# Patient Record
Sex: Female | Born: 1995
Health system: Southern US, Community
[De-identification: ages and names within clinical notes are randomized; demographics above are authoritative.]

## PROBLEM LIST (undated history)

## (undated) DIAGNOSIS — J45909 Unspecified asthma, uncomplicated: Secondary | ICD-10-CM

## (undated) HISTORY — PX: NO PAST SURGERIES: SHX2092

---
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
## 2016-03-31 ENCOUNTER — Other Ambulatory Visit
Admission: RE | Admit: 2016-03-31 | Discharge: 2016-03-31 | Disposition: A | Discharge status: Home / Self Care | Payer: BLUE CROSS/BLUE SHIELD | Source: Ambulatory Visit | Attending: Physician Assistant | Admitting: Physician Assistant

## 2016-03-31 DIAGNOSIS — Z113 Encounter for screening for infections with a predominantly sexual mode of transmission: Secondary | ICD-10-CM | POA: Insufficient documentation

## 2016-04-01 LAB — HIV ANTIBODY (ROUTINE TESTING W REFLEX): HIV Screen 4th Generation wRfx: NONREACTIVE

## 2016-04-01 LAB — SYPHILIS: RPR W/REFLEX TO RPR TITER AND TREPONEMAL ANTIBODIES, TRADITIONAL SCREENING AND DIAGNOSIS ALGORITHM: RPR Ser Ql: NONREACTIVE

## 2017-03-08 ENCOUNTER — Encounter: Payer: Self-pay | Admitting: *Deleted

## 2017-03-14 NOTE — Discharge Instructions (Signed)
T & A INSTRUCTION SHEET - [facility] °[facility] ° °CREIGHTON VAUGHT, MD °PAUL H. JUENGEL, MD  °P. SCOTT BENNETT °CHAPMAN MCQUEEN, MD ° °[address] , [place] [address] [place]. [phone] °[address] [place] [address] [phone] ° °INFORMATION SHEET FOR A TONSILLECTOMY AND ADENDOIDECTOMY ° °About Your Tonsils and Adenoids ° The tonsils and adenoids are normal body tissues that are part of our immune system.  They normally help to protect us against diseases that may enter our mouth and nose.  However, sometimes the tonsils and/or adenoids become too large and obstruct our breathing, especially at night. °  ° If either of these things happen it helps to remove the tonsils and adenoids in order to become healthier. The operation to remove the tonsils and adenoids is called a tonsillectomy and adenoidectomy. ° °The Location of Your Tonsils and Adenoids ° The tonsils are located in the back of the throat on both side and sit in a cradle of muscles. The adenoids are located in the roof of the mouth, behind the nose, and closely associated with the opening of the Eustachian tube to the ear. ° °Surgery on Tonsils and Adenoids ° A tonsillectomy and adenoidectomy is a short operation which takes about thirty minutes.  This includes being put to sleep and being awakened.  Tonsillectomies and adenoidectomies are performed at [facility] and may require observation period in the recovery room prior to going home. ° °Following the Operation for a Tonsillectomy ° A cautery machine is used to control bleeding.  Bleeding from a tonsillectomy and adenoidectomy is minimal and postoperatively the risk of bleeding is approximately four percent, although this rarely life threatening. ° ° ° °After your tonsillectomy and adenoidectomy post-op care at home: ° °1. Our patients are able to go home the same day.  You may be given prescriptions for pain  medications and antibiotics, if indicated. °2. It is extremely important to remember that fluid intake is of utmost importance after a tonsillectomy.  The amount that you drink must be maintained in the postoperative period.  A good indication of whether a child is getting enough fluid is whether his/her urine output is constant.  As long as children are urinating or wetting their diaper every 6 - 8 hours this is usually enough fluid intake.   °3. Although rare, this is a risk of some bleeding in the first ten days after surgery.  This is usually occurs between day five and nine postoperatively.  This risk of bleeding is approximately four percent.  If you or your child should have any bleeding you should remain calm and notify our office or go directly to the Emergency Room at [facility] where they will contact us. Our doctors are available seven days a week for notification.  We recommend sitting up quietly in a chair, place an ice pack on the front of the neck and spitting out the blood gently until we are able to contact you.  Adults should gargle gently with ice water and this may help stop the bleeding.  If the bleeding does not stop after a short time, i.e. 10 to 15 minutes, or seems to be increasing again, please contact us or go to the hospital.   °4. It is common for the pain to be worse at 5 - 7 days postoperatively.  This occurs because the “scab” is peeling off and the mucous membrane (skin of   the throat) is growing back where the tonsils were.   °5. It is common for a low-grade fever, less than 102, during the first week after a tonsillectomy and adenoidectomy.  It is usually due to not drinking enough liquids, and we suggest your use liquid Tylenol or the pain medicine with Tylenol prescribed in order to keep your temperature below 102.  Please follow the directions on the back of the bottle. °6. Do not take aspirin or any products that contain aspirin such as Bufferin, Anacin,  Ecotrin, aspirin gum, Goodies, BC headache powders, etc., after a T&A because it can promote bleeding.  Please check with our office before administering any other medication that may been prescribed by other doctors during the two week post-operative period. °7. If you happen to look in the mirror or into your child’s mouth you will see white/gray patches on the back of the throat.  This is what a scab looks like in the mouth and is normal after having a T&A.  It will disappear once the tonsil area heals completely. However, it may cause a noticeable odor, and this too will disappear with time.     °8. You or your child may experience ear pain after having a T&A.  This is called referred pain and comes from the throat, but it is felt in the ears.  Ear pain is quite common and expected.  It will usually go away after ten days.  There is usually nothing wrong with the ears, and it is primarily due to the healing area stimulating the nerve to the ear that runs along the side of the throat.  Use either the prescribed pain medicine or Tylenol as needed.  °9. The throat tissues after a tonsillectomy are obviously sensitive.  Smoking around children who have had a tonsillectomy significantly increases the risk of bleeding.  DO NOT SMOKE!  ° °General Anesthesia, Adult, Care After °These instructions provide you with information about caring for yourself after your procedure. Your health care provider may also give you more specific instructions. Your treatment has been planned according to current medical practices, but problems sometimes occur. Call your health care provider if you have any problems or questions after your procedure. °What can I expect after the procedure? °After the procedure, it is common to have: °· Vomiting. °· A sore throat. °· Mental slowness. ° °It is common to feel: °· Nauseous. °· Cold or shivery. °· Sleepy. °· Tired. °· Sore or achy, even in parts of your body where you did not have  surgery. ° °Follow these instructions at home: °For at least 24 hours after the procedure: °· Do not: °? Participate in activities where you could fall or become injured. °? Drive. °? Use heavy machinery. °? Drink alcohol. °? Take sleeping pills or medicines that cause drowsiness. °? Make important decisions or sign legal documents. °? Take care of children on your own. °· Rest. °Eating and drinking °· If you vomit, drink water, juice, or soup when you can drink without vomiting. °· Drink enough fluid to keep your urine clear or pale yellow. °· Make sure you have little or no nausea before eating solid foods. °· Follow the diet recommended by your health care provider. °General instructions °· Have a responsible adult stay with you until you are awake and alert. °· Return to your normal activities as told by your health care provider. Ask your health care provider what activities are safe for you. °· Take over-the-counter and   prescription medicines only as told by your health care provider. °· If you smoke, do not smoke without supervision. °· Keep all follow-up visits as told by your health care provider. This is important. °Contact a health care provider if: °· You continue to have nausea or vomiting at home, and medicines are not helpful. °· You cannot drink fluids or start eating again. °· You cannot urinate after 8-12 hours. °· You develop a skin rash. °· You have fever. °· You have increasing redness at the site of your procedure. °Get help right away if: °· You have difficulty breathing. °· You have chest pain. °· You have unexpected bleeding. °· You feel that you are having a life-threatening or urgent problem. °This information is not intended to replace advice given to you by your health care provider. Make sure you discuss any questions you have with your health care provider. °Document Released: 10/31/2000 Document Revised: 12/28/2015 Document Reviewed: 07/09/2015 °Elsevier Interactive Patient Education  © 2018 [employer] ° °

## 2017-03-16 ENCOUNTER — Ambulatory Visit
Admission: RE | Admit: 2017-03-16 | Discharge: 2017-03-16 | Disposition: A | Discharge status: Home / Self Care | Payer: BLUE CROSS/BLUE SHIELD | Source: Ambulatory Visit | Attending: Otolaryngology | Admitting: Otolaryngology

## 2017-03-16 ENCOUNTER — Ambulatory Visit: Payer: BLUE CROSS/BLUE SHIELD | Admitting: Anesthesiology

## 2017-03-16 ENCOUNTER — Encounter
Admission: RE | Disposition: A | Discharge status: Home / Self Care | Payer: Self-pay | Source: Ambulatory Visit | Attending: Otolaryngology

## 2017-03-16 DIAGNOSIS — J3501 Chronic tonsillitis: Secondary | ICD-10-CM | POA: Insufficient documentation

## 2017-03-16 DIAGNOSIS — J351 Hypertrophy of tonsils: Secondary | ICD-10-CM | POA: Diagnosis present

## 2017-03-16 HISTORY — DX: Unspecified asthma, uncomplicated: J45.909

## 2017-03-16 HISTORY — PX: TONSILLECTOMY: SHX5217

## 2017-03-16 SURGERY — TONSILLECTOMY
Anesthesia: General | Wound class: Clean Contaminated

## 2017-03-16 MED ORDER — SUCCINYLCHOLINE CHLORIDE 20 MG/ML IJ SOLN
INTRAMUSCULAR | Status: DC | PRN
Start: 2017-03-16 — End: 2017-03-16
  Administered 2017-03-16: 80 mg via INTRAVENOUS

## 2017-03-16 MED ORDER — ONDANSETRON HCL 4 MG/2ML IJ SOLN: 4.0000 mg | Freq: Once | INTRAMUSCULAR | Status: DC | PRN

## 2017-03-16 MED ORDER — DEXAMETHASONE SODIUM PHOSPHATE 4 MG/ML IJ SOLN
INTRAMUSCULAR | Status: DC | PRN
  Administered 2017-03-16: 10 mg via INTRAVENOUS

## 2017-03-16 MED ORDER — FENTANYL CITRATE (PF) 100 MCG/2ML IJ SOLN
INTRAMUSCULAR | Status: DC | PRN
  Administered 2017-03-16 (×2): 25 ug via INTRAVENOUS
  Administered 2017-03-16: 100 ug via INTRAVENOUS

## 2017-03-16 MED ORDER — OXYCODONE HCL 5 MG PO TABS: 5.0000 mg | ORAL_TABLET | Freq: Once | ORAL | Status: AC | PRN

## 2017-03-16 MED ORDER — GLYCOPYRROLATE 0.2 MG/ML IJ SOLN
INTRAMUSCULAR | Status: DC | PRN
  Administered 2017-03-16: 0.1 mg via INTRAVENOUS

## 2017-03-16 MED ORDER — MIDAZOLAM HCL 5 MG/5ML IJ SOLN
INTRAMUSCULAR | Status: DC | PRN
  Administered 2017-03-16: 2 mg via INTRAVENOUS

## 2017-03-16 MED ORDER — ACETAMINOPHEN 10 MG/ML IV SOLN
1000.0000 mg | Freq: Once | INTRAVENOUS | Status: DC | PRN
  Administered 2017-03-16: 1000 mg via INTRAVENOUS

## 2017-03-16 MED ORDER — LIDOCAINE HCL (CARDIAC) 20 MG/ML IV SOLN
INTRAVENOUS | Status: DC | PRN
  Administered 2017-03-16: 50 mg via INTRAVENOUS

## 2017-03-16 MED ORDER — ONDANSETRON HCL 4 MG/2ML IJ SOLN
INTRAMUSCULAR | Status: DC | PRN
  Administered 2017-03-16: 4 mg via INTRAVENOUS

## 2017-03-16 MED ORDER — FENTANYL CITRATE (PF) 100 MCG/2ML IJ SOLN: 25.0000 ug | INTRAMUSCULAR | Status: DC | PRN

## 2017-03-16 MED ORDER — PROPOFOL 10 MG/ML IV BOLUS
INTRAVENOUS | Status: DC | PRN
  Administered 2017-03-16: 130 mg via INTRAVENOUS

## 2017-03-16 MED ORDER — LACTATED RINGERS IV SOLN: INTRAVENOUS | Status: DC

## 2017-03-16 MED ORDER — IBUPROFEN 100 MG/5ML PO SUSP: 5.0000 mg/kg | Freq: Once | ORAL | Status: DC

## 2017-03-16 MED ORDER — OXYCODONE HCL 5 MG/5ML PO SOLN
5.0000 mg | Freq: Once | ORAL | Status: AC | PRN
  Administered 2017-03-16: 5 mg via ORAL

## 2017-03-16 MED ORDER — LACTATED RINGERS IV SOLN
INTRAVENOUS | Status: DC
  Administered 2017-03-16: 09:00:00 via INTRAVENOUS

## 2017-03-16 SURGICAL SUPPLY — 12 items
CANISTER SUCT 1200ML W/VALVE (MISCELLANEOUS) ×3 IMPLANT
ELECT CAUTERY BLADE TIP 2.5 (TIP) ×3
ELECTRODE CAUTERY BLDE TIP 2.5 (TIP) ×1 IMPLANT
GLOVE PI ULTRA LF STRL 7.5 (GLOVE) ×1 IMPLANT
GLOVE PI ULTRA NON LATEX 7.5 (GLOVE) ×2
KIT ROOM TURNOVER OR (KITS) ×3 IMPLANT
PACK TONSIL/ADENOIDS (PACKS) ×3 IMPLANT
PAD GROUND ADULT SPLIT (MISCELLANEOUS) ×3 IMPLANT
PENCIL ELECTRO HAND CTR (MISCELLANEOUS) ×3 IMPLANT
SOL ANTI-FOG 6CC FOG-OUT (MISCELLANEOUS) ×1 IMPLANT
SOL FOG-OUT ANTI-FOG 6CC (MISCELLANEOUS) ×2
STRAP BODY AND KNEE 60X3 (MISCELLANEOUS) ×3 IMPLANT

## 2017-03-16 NOTE — Anesthesia Preprocedure Evaluation (Signed)
Anesthesia Evaluation  Patient identified by MRN, date of birth, ID band Patient awake    Reviewed: Allergy & Precautions, NPO status , Patient's Chart, lab work & pertinent test results  History of Anesthesia Complications Negative for: history of anesthetic complications  Airway Mallampati: I  TM Distance: >3 FB Neck ROM: Full    Dental no notable dental hx.    Pulmonary asthma ,    Pulmonary exam normal breath sounds clear to auscultation       Cardiovascular Exercise Tolerance: Good negative cardio ROS Normal cardiovascular exam Rhythm:Regular Rate:Normal     Neuro/Psych negative neurological ROS     GI/Hepatic negative GI ROS,   Endo/Other  negative endocrine ROS  Renal/GU negative Renal ROS     Musculoskeletal   Abdominal   Peds  Hematology negative hematology ROS (+)   Anesthesia Other Findings   Reproductive/Obstetrics                             Anesthesia Physical Anesthesia Plan  ASA: II  Anesthesia Plan: General   Post-op Pain Management:    Induction: Intravenous  PONV Risk Score and Plan: 2 and Ondansetron and Dexamethasone  Airway Management Planned: Oral ETT  Additional Equipment:   Intra-op Plan:   Post-operative Plan: Extubation in OR  Informed Consent: I have reviewed the patients History and Physical, chart, labs and discussed the procedure including the risks, benefits and alternatives for the proposed anesthesia with the patient or authorized representative who has indicated his/her understanding and acceptance.     Plan Discussed with: CRNA  Anesthesia Plan Comments:         Anesthesia Quick Evaluation

## 2017-03-16 NOTE — H&P (Signed)
H&P has been reviewed and pt reevaluated, and no changes necessary. To be downloaded later.  

## 2017-03-16 NOTE — Op Note (Signed)
03/16/2017  9:45 AM    Robert BellowLouros, Kimberly  [id number]   Pre-Op Dx:  Chronic tonsillitis, hypertrophied tonsils  Post-op Dx: Same  Proc: Tonsillectomy   Surg:  [name] H  Anes:  GOT  EBL:  Minimal  Comp:  None  Findings:  Very large cryptic tonsils  Procedure: The patient was brought to the operating room placed in supine position. Davis mouth gag was used to visualize the oropharynx. The tonsils were markedly enlarged. The soft palate was retracted to visualize the adenoids and these were not enlarged nor obstructing any of the back of the nose. The tonsils were grasped and pulled medially. The anterior pillar was incised electrocautery. The tonsil was dissected from its fossa using blunt dissection and electrocautery. Bleeding was controlled with electrocautery and direct pressure. His was done on both sides.  Patient tolerated the procedure well. She was awakened and taken to the recovery room in satisfactory condition. There were no operative complications.  Dispo:   To PACU to be discharged home  Plan:  To push fluids at home and make sure she gets some protein in as well. Will use some Tylenol with codeine liquid for pain as necessary. She can supplement with Tylenol. Plan to follow-up in about 2 weeks.  [name] H  03/16/2017 9:45 AM

## 2017-03-16 NOTE — Anesthesia Postprocedure Evaluation (Signed)
Anesthesia Post Note  Patient: Kimberly Moyer  Procedure(s) Performed: Procedure(s) (LRB): TONSILLECTOMY (N/A)  Patient location during evaluation: PACU Anesthesia Type: General Level of consciousness: awake and alert, oriented and patient cooperative Pain management: pain level controlled Vital Signs Assessment: post-procedure vital signs reviewed and stable Respiratory status: spontaneous breathing, nonlabored ventilation and respiratory function stable Cardiovascular status: blood pressure returned to baseline and stable Postop Assessment: adequate PO intake Anesthetic complications: no    Reed BreechAndrea [place] Festa

## 2017-03-16 NOTE — Anesthesia Procedure Notes (Signed)
Procedure Name: Intubation Date/Time: 03/16/2017 9:27 AM Performed by: Jimmy PicketAMYOT, Jakaiden Fill Pre-anesthesia Checklist: Patient identified, Emergency Drugs available, Suction available, Patient being monitored and Timeout performed Patient Re-evaluated:Patient Re-evaluated prior to induction Oxygen Delivery Method: Circle system utilized Preoxygenation: Pre-oxygenation with 100% oxygen Induction Type: IV induction Ventilation: Mask ventilation without difficulty Laryngoscope Size: Miller and 2 Grade View: Grade I Tube type: Oral Rae Tube size: 7.0 mm Number of attempts: 1 Placement Confirmation: ETT inserted through vocal cords under direct vision,  positive ETCO2 and breath sounds checked- equal and bilateral Tube secured with: Tape Dental Injury: Teeth and Oropharynx as per pre-operative assessment

## 2017-03-16 NOTE — Transfer of Care (Signed)
Immediate Anesthesia Transfer of Care Note  Patient: Kimberly EndsJessica M Moyer  Procedure(s) Performed: Procedure(s): TONSILLECTOMY (N/A)  Patient Location: PACU  Anesthesia Type: General  Level of Consciousness: awake, alert  and patient cooperative  Airway and Oxygen Therapy: Patient Spontanous Breathing and Patient connected to supplemental oxygen  Post-op Assessment: Post-op Vital signs reviewed, Patient's Cardiovascular Status Stable, Respiratory Function Stable, Patent Airway and No signs of Nausea or vomiting  Post-op Vital Signs: Reviewed and stable  Complications: No apparent anesthesia complications

## 2017-03-18 ENCOUNTER — Encounter: Payer: Self-pay | Admitting: Otolaryngology

## 2017-03-20 LAB — SURGICAL PATHOLOGY

## 2017-03-25 ENCOUNTER — Emergency Department
Admission: EM | Admit: 2017-03-25 | Discharge: 2017-03-25 | Disposition: A | Discharge status: Home / Self Care | Payer: BLUE CROSS/BLUE SHIELD | Attending: Emergency Medicine | Admitting: Emergency Medicine

## 2017-03-25 DIAGNOSIS — R55 Syncope and collapse: Secondary | ICD-10-CM | POA: Insufficient documentation

## 2017-03-25 DIAGNOSIS — J45909 Unspecified asthma, uncomplicated: Secondary | ICD-10-CM | POA: Insufficient documentation

## 2017-03-25 DIAGNOSIS — Z79899 Other long term (current) drug therapy: Secondary | ICD-10-CM | POA: Insufficient documentation

## 2017-03-25 DIAGNOSIS — E860 Dehydration: Secondary | ICD-10-CM | POA: Diagnosis not present

## 2017-03-25 LAB — BASIC METABOLIC PANEL
Anion gap: 8 (ref 5–15)
Anion gap: 7 (ref 5–15)
BUN: 18 mg/dL (ref 6–20)
BUN: 14 mg/dL (ref 6–20)
CALCIUM: 9.1 mg/dL (ref 8.9–10.3)
CALCIUM: 8.7 mg/dL — AB (ref 8.9–10.3)
CO2: 22 mmol/L (ref 22–32)
CO2: 21 mmol/L — ABNORMAL LOW (ref 22–32)
CREATININE: 0.62 mg/dL (ref 0.44–1.00)
CREATININE: 0.66 mg/dL (ref 0.44–1.00)
Chloride: 108 mmol/L (ref 101–111)
Chloride: 110 mmol/L (ref 101–111)
GFR calc Af Amer: >60 mL/min (ref >60)
GLUCOSE: 143 mg/dL — AB (ref 65–99)
Glucose, Bld: 115 mg/dL — ABNORMAL HIGH (ref 65–99)
POTASSIUM: 3.4 mmol/L — AB (ref 3.5–5.1)
Potassium: 3.4 mmol/L — ABNORMAL LOW (ref 3.5–5.1)
SODIUM: 138 mmol/L (ref 135–145)
SODIUM: 138 mmol/L (ref 135–145)

## 2017-03-25 LAB — CBC WITH DIFFERENTIAL/PLATELET
BASOS PCT: 0 %
Basophils Absolute: 0.1 10*3/uL (ref 0–0.1)
Basophils Absolute: 0 10*3/uL (ref 0–0.1)
Basophils Relative: 1 %
EOS ABS: 0.1 10*3/uL (ref 0–0.7)
EOS ABS: 0 10*3/uL (ref 0–0.7)
EOS PCT: 1 %
Eosinophils Relative: 0 %
HCT: 26.8 % — ABNORMAL LOW (ref 35.0–47.0)
HCT: 22 % — ABNORMAL LOW (ref 35.0–47.0)
HEMOGLOBIN: 8.1 g/dL — AB (ref 12.0–16.0)
Hemoglobin: 9.5 g/dL — ABNORMAL LOW (ref 12.0–16.0)
LYMPHS ABS: 2.1 10*3/uL (ref 1.0–3.6)
Lymphocytes Relative: 19 %
Lymphocytes Relative: 13 %
Lymphs Abs: 1.3 10*3/uL (ref 1.0–3.6)
MCH: 30 pg (ref 26.0–34.0)
MCH: 30.6 pg (ref 26.0–34.0)
MCHC: 35.3 g/dL (ref 32.0–36.0)
MCHC: 36.6 g/dL — AB (ref 32.0–36.0)
MCV: 85 fL (ref 80.0–100.0)
MCV: 83.7 fL (ref 80.0–100.0)
MONO ABS: 0.3 10*3/uL (ref 0.2–0.9)
MONOS PCT: 6 %
MONOS PCT: 3 %
Monocytes Absolute: 0.7 10*3/uL (ref 0.2–0.9)
NEUTROS PCT: 84 %
Neutro Abs: 8.5 10*3/uL — ABNORMAL HIGH (ref 1.4–6.5)
Neutro Abs: 8.4 10*3/uL — ABNORMAL HIGH (ref 1.4–6.5)
Neutrophils Relative %: 73 %
PLATELETS: 331 10*3/uL (ref 150–440)
Platelets: 253 10*3/uL (ref 150–440)
RBC: 3.16 MIL/uL — ABNORMAL LOW (ref 3.80–5.20)
RBC: 2.63 MIL/uL — ABNORMAL LOW (ref 3.80–5.20)
RDW: 12.4 % (ref 11.5–14.5)
RDW: 12.6 % (ref 11.5–14.5)
WBC: 11.5 10*3/uL — AB (ref 3.6–11.0)
WBC: 10.1 10*3/uL (ref 3.6–11.0)

## 2017-03-25 LAB — HEPATIC FUNCTION PANEL
ALT: 105 U/L — ABNORMAL HIGH (ref 14–54)
AST: 70 U/L — AB (ref 15–41)
Albumin: 3.6 g/dL (ref 3.5–5.0)
Alkaline Phosphatase: 54 U/L (ref 38–126)
BILIRUBIN TOTAL: 0.3 mg/dL (ref 0.3–1.2)
Total Protein: 6.3 g/dL — ABNORMAL LOW (ref 6.5–8.1)

## 2017-03-25 LAB — ABO/RH: ABO/RH(D): O POS

## 2017-03-25 MED ORDER — SODIUM CHLORIDE 0.9 % IV SOLN
Freq: Once | INTRAVENOUS | Status: AC
  Administered 2017-03-25: 18:00:00 via INTRAVENOUS

## 2017-03-25 MED ORDER — SODIUM CHLORIDE 0.9 % IV BOLUS (SEPSIS)
1000.0000 mL | Freq: Once | INTRAVENOUS | Status: AC
  Administered 2017-03-25: 1000 mL via INTRAVENOUS

## 2017-03-25 MED ORDER — DEXAMETHASONE SODIUM PHOSPHATE 10 MG/ML IJ SOLN
10.0000 mg | Freq: Once | INTRAMUSCULAR | Status: AC
  Administered 2017-03-25: 10 mg via INTRAVENOUS
  Filled 2017-03-25: qty 1

## 2017-03-25 MED ORDER — SODIUM CHLORIDE 0.9 % IV SOLN: 10.0000 mL/h | Freq: Once | INTRAVENOUS | Status: DC

## 2017-03-25 NOTE — ED Provider Notes (Signed)
Discussed with Dr. Oneida Alar. He saw the patient on Tuesday. She has not been vomiting up much blood. She's only been drinking water nothing else. She has not been eating. He thinks she is probably dehydrated. Once we give HER-2 liters of fluid and 10 of Decadron. Possibly some fentanyl as needed. I have ordered this and the electrolytes as he wished and of course a CBC. We will type and cross her just in case. I have not yet seen this patient this is based on the history obtained from the triage nurse the vital signs and I discussing the patient Dr.Juengel.   Nena Polio, MD 03/25/17 [phone]

## 2017-03-25 NOTE — ED Provider Notes (Signed)
[facility] Emergency Department Provider Note ____________________________________________   First MD Initiated Contact with Patient 03/25/17 1750     (approximate)  I have reviewed the triage vital signs and the nursing notes.   HISTORY  Chief Complaint Loss of Consciousness    HPI Kimberly Moyer is a 21 y.o. female who presents with syncope 2 earlier today acute onset associated with coughing up blood. Patient had tonsillectomy 9 days ago with subsequent episode of coughing up blood 4 days ago for which she went to her ENTs office and which had resolved. Patient states after taking pain medication last night she began to spit up a little bit of blood and then today coughed up a slightly larger amount. States it was still less than what she bled 4 days ago. Patient states after this she felt nauseous and weak and then syncopized. After awakening when she left the bathroom she syncopized again. No associated trauma. She states she has had significantly decreased PO intake since the procedure but did eat a little bit the last few days. Patient called her ENT Dr. Ladene Moyer who recommended that she come to the emergency department. Patient still feels a bit weak but otherwise back to baseline. No further bleeding.  Past Medical History:  Diagnosis Date  . Asthma     There are no active problems to display for this patient.   Past Surgical History:  Procedure Laterality Date  . NO PAST SURGERIES    . TONSILLECTOMY N/A 03/16/2017   Procedure: TONSILLECTOMY;  Surgeon: Margaretha Sheffield, MD;  Location: [facility];  Service: ENT;  Laterality: N/A;    Prior to Admission medications   Medication Sig Start Date End Date Taking? Authorizing Provider  Calcium Carbonate-Vitamin D (CALCIUM 600+D PO) Take by mouth.   Yes [provider]  cetirizine (ZYRTEC) 10 MG tablet Take 10 mg by mouth daily.   Yes [provider]  HYDROcodone-acetaminophen  (HYCET) 7.5-325 mg/15 ml solution Take 15 mLs by mouth every 4 (four) hours as needed. 03/21/17  Yes [provider]  MedroxyPROGESTERone Acetate (DEPO-PROVERA IM) Inject into the muscle.   Yes [provider]  Multiple Vitamin (MULTIVITAMIN) tablet Take 1 tablet by mouth daily.   Yes [provider]  albuterol (PROVENTIL HFA;VENTOLIN HFA) 108 (90 Base) MCG/ACT inhaler Inhale into the lungs every 6 (six) hours as needed for wheezing or shortness of breath.    [provider]  ondansetron (ZOFRAN) 4 MG tablet Take 4 mg by mouth every 8 (eight) hours as needed.  03/21/17   [provider]    Allergies Banana; Carrot [daucus carota]; Mushroom extract complex; and Watermelon [citrullus vulgaris]  No family history on file.  Social History Social History  Substance Use Topics  . Smoking status: Never Smoker  . Smokeless tobacco: Never Used  . Alcohol use 0.6 oz/week    1 Cans of beer per week    Review of Systems  Constitutional: No fever/chills Eyes: No visual changes. ENT: Positive for throat pain. Cardiovascular: Denies chest pain. Respiratory: Denies shortness of breath. Gastrointestinal: Positive for vomiting. No diarrhea.  Genitourinary: Negative for dysuria.  Musculoskeletal: Negative for back pain. Skin: Negative for rash. Neurological: Negative for headaches, focal weakness or numbness. Positive for lightheadedness.    ____________________________________________   PHYSICAL EXAM:  VITAL SIGNS: ED Triage Vitals  Enc Vitals Group     BP 03/25/17 1637 127/66     Pulse Rate 03/25/17 1637 (!) 132  Resp 03/25/17 1637 14     Temp 03/25/17 1637 98.8 F (37.1 C)     Temp Source 03/25/17 1637 Oral     SpO2 03/25/17 1637 100 %     Weight 03/25/17 1637 127 lb (57.6 kg)     Height 03/25/17 1637 _0  (1.676 m)     Head Circumference --      Peak Flow --      Pain Score 03/25/17 1636 5     Pain Loc --      Pain Edu? --       Excl. in [place]? --     Constitutional: Alert and oriented. Well appearing and in no acute distress. Eyes: Conjunctivae are normal.  Head: Atraumatic. Nose: No congestion/rhinnorhea. Mouth/Throat: Mucous membranes are moist.  Oropharynx with mild erythema but no significant swelling and no blood.  Neck: Normal range of motion.  No lymphadenopathy. No stridor  Cardiovascular: Normal rate, regular rhythm. Grossly normal heart sounds.  Good peripheral circulation. Respiratory: Normal respiratory effort.  No retractions. Lungs CTAB. Gastrointestinal: No distention.  Genitourinary: No CVA tenderness. Musculoskeletal: No lower extremity edema.  Extremities warm and well perfused.  Neurologic:  Normal speech and language. No gross focal neurologic deficits are appreciated.  Skin:  Skin is warm and dry. No rash noted.Slightly pale.  Psychiatric: Mood and affect are normal. Speech and behavior are normal.  ____________________________________________   LABS (all labs ordered are listed, but only abnormal results are displayed)  Labs Reviewed  BASIC METABOLIC PANEL - Abnormal; Notable for the following:       Result Value   Potassium 3.4 (*)    Glucose, Bld 143 (*)    All other components within normal limits  HEPATIC FUNCTION PANEL - Abnormal; Notable for the following:    Total Protein 6.3 (*)    AST 70 (*)    ALT 105 (*)    Bilirubin, Direct <0.1 (*)    All other components within normal limits  CBC WITH DIFFERENTIAL/PLATELET - Abnormal; Notable for the following:    WBC 11.5 (*)    RBC 3.16 (*)    Hemoglobin 9.5 (*)    HCT 26.8 (*)    Neutro Abs 8.5 (*)    All other components within normal limits  CBC WITH DIFFERENTIAL/PLATELET - Abnormal; Notable for the following:    RBC 2.63 (*)    Hemoglobin 8.1 (*)    HCT 22.0 (*)    MCHC 36.6 (*)    Neutro Abs 8.4 (*)    All other components within normal limits  BASIC METABOLIC PANEL - Abnormal; Notable for the following:    Potassium  3.4 (*)    CO2 21 (*)    Glucose, Bld 115 (*)    Calcium 8.7 (*)    All other components within normal limits  PREGNANCY, URINE  TYPE AND SCREEN  PREPARE RBC (CROSSMATCH)  ABO/RH   ____________________________________________  EKG  ED ECG REPORT I, Arta Silence, the attending physician, personally viewed and interpreted this ECG.  Date: 03/25/2017 EKG Time: 1651 Rate: 119 Rhythm: Sinus tachycardia QRS Axis: Rightward axis Intervals: normal ST/T Wave abnormalities: Nonspecific T wave inversions in lead V4=V6 Narrative Interpretation: Nonspecific abnormalities likely rate related  ____________________________________________  RADIOLOGY    ____________________________________________   PROCEDURES  Procedure(s) performed: No    Critical Care performed: No ____________________________________________   INITIAL IMPRESSION / ASSESSMENT AND PLAN / ED COURSE  Pertinent labs & imaging results that were available during my care  of the patient were reviewed by me and considered in my medical decision making44 year old female history of asthma presents with syncope 2 associated with coughing up some blood after a tonsillectomy 9 days ago. Patient overall has had decreased by mouth intake since the procedure although has eaten solids in the last few days. She had some bleeding earlier which resolved and states today's bleeding was less severe and is currently resolved. On arrival patient tachycardic, but other vital signs normal. Patient is relatively well-appearing exam is as described. There is no evidence of active hemorrhage. Patient's ENT called into ED and recommended fluids, Decadron, and labs. Syncope appears consistent with vasovagal episode and is likely related more towards patient's decreased by mouth intake and possible dehydration rather than acute hemorrhage. EKG has nonspecific findings but no signs of arrhythmia or ischemia. Plan: 2 L IV fluid, basic labs,  repeat CBC, and reassess. _______________________________________  ----------------------------------------- 8:51 PM on 03/25/2017 -----------------------------------------  After initial treatment patient's heart rate improved to 100-110 and she was feeling significantly better. No active bleeding in the ED. I discussed her course with Dr. Ladene Moyer who agreed with sending her home if she was feeling better. However after getting up and walking around patient started to feel very lightheaded and weak again and did not feel well to go home. I ordered repeat BMP and CBC.  ----------------------------------------- 9:41 PM on 03/25/2017 -----------------------------------------  On reassessment after third liter patient got up and walked around and felt much better. She has been tolerating significant amounts of PO including eating mac & cheese and drinking fluids. On repeat CBC the hemoglobin dropped from 9.5 to 8.2 however this is consistent with dilutional effect in a patient of her size.  The K is also borderline but did not drop further. Patient has not spit up any blood or had any evidence of bleeding since approximately noon. Therefore there is no evidence of acute blood loss. Patient feels well and would like to go home. On reassessment her heart rate is 99-102.  Also, patient never did a urine pregnancy however she states she had a negative urine pregnancy 9 days ago when she had the procedure and has not been sexually active since. Therefore I do not think that she needs to wait around for this to be done.  I gave return precautions including new or worsening bleeding, persistent bleeding,  lightheadedness or weakness, or any other new or worsening symptoms that concern her. Patient will follow up with her ENT this week.   FINAL CLINICAL IMPRESSION(S) / ED DIAGNOSES  Final diagnoses:  Vasovagal syncope  Dehydration      NEW MEDICATIONS STARTED DURING THIS VISIT:  New Prescriptions     No medications on file     Note:  This document was prepared using Dragon voice recognition software and may include unintentional dictation errors.    Arta Silence, MD 03/25/17 2145

## 2017-03-25 NOTE — ED Notes (Signed)
Pt up to urinate. 

## 2017-03-25 NOTE — ED Notes (Signed)
Pt eating soft foods. Family at bedside.

## 2017-03-25 NOTE — ED Triage Notes (Signed)
Pt presents via POV c/o syncope x2 at home. Had tonsils taken 8/9 with c/o sore throat and throwing up clots on Tuesday and today. Seen ENT on Tuesday.

## 2017-03-25 NOTE — ED Notes (Signed)
Pt up to commode to urinate and have bowel movement. Pt's mother states "she told me everything doesn't seem clear when she sits up". Pt with sallow skin.

## 2017-03-25 NOTE — ED Notes (Signed)
Explanation of treatment plan provided to pt and family. Family and pt verbalize understanding.  

## 2017-03-25 NOTE — Discharge Instructions (Signed)
Return to the ER if you feel new or worsening faintness or weakness, difficulty breathing, fever or chills, or have recurrent or worsening bleeding.

## 2017-03-25 NOTE — ED Notes (Signed)
md notified of orthostatics. Orders for additional lab work and ns bolus received.

## 2017-03-26 LAB — TYPE AND SCREEN
ABO/RH(D): O POS
ANTIBODY SCREEN: NEGATIVE
UNIT DIVISION: 0
Unit division: 0

## 2017-03-26 LAB — BPAM RBC
BLOOD PRODUCT EXPIRATION DATE: 201808222359
BLOOD PRODUCT EXPIRATION DATE: 201809042359
UNIT TYPE AND RH: 5100
UNIT TYPE AND RH: 5100

## 2017-03-27 LAB — PREPARE RBC (CROSSMATCH)

## 2018-11-28 DIAGNOSIS — Z3042 Encounter for surveillance of injectable contraceptive: Secondary | ICD-10-CM | POA: Diagnosis not present

## 2019-01-07 DIAGNOSIS — Z01419 Encounter for gynecological examination (general) (routine) without abnormal findings: Secondary | ICD-10-CM | POA: Diagnosis not present

## 2019-01-07 DIAGNOSIS — R87610 Atypical squamous cells of undetermined significance on cytologic smear of cervix (ASC-US): Secondary | ICD-10-CM | POA: Diagnosis not present

## 2019-01-07 DIAGNOSIS — Z139 Encounter for screening, unspecified: Secondary | ICD-10-CM | POA: Diagnosis not present

## 2019-01-07 DIAGNOSIS — Z124 Encounter for screening for malignant neoplasm of cervix: Secondary | ICD-10-CM | POA: Diagnosis not present

## 2019-01-07 DIAGNOSIS — Z3040 Encounter for surveillance of contraceptives, unspecified: Secondary | ICD-10-CM | POA: Diagnosis not present

## 2019-02-26 DIAGNOSIS — Z3042 Encounter for surveillance of injectable contraceptive: Secondary | ICD-10-CM | POA: Diagnosis not present

## 2019-04-30 ENCOUNTER — Telehealth: Payer: Self-pay | Admitting: *Deleted

## 2019-04-30 NOTE — Telephone Encounter (Signed)
Did you say you would accept pt?

## 2019-04-30 NOTE — Telephone Encounter (Signed)
Copied from [place] [id number]. Topic: General - Other >> Apr 30, 2019  4:08 PM Keene Breath wrote: Reason for CRM: Patient's mother called to verify that Dr. Derrel Nip would take patient on as a new patient.  Mother stated that she asked the doctor a while back and she said she would take her as a new patient.  Please call to confirm so that she can make an appt.  CB# [phone]

## 2019-05-01 NOTE — Telephone Encounter (Signed)
Will you call to schedule a new patient appt.

## 2019-05-01 NOTE — Telephone Encounter (Signed)
Yes I did

## 2019-05-03 NOTE — Telephone Encounter (Signed)
Lm to call office to set up a new patient appointment, this appointment should be set up as a morning virtual appt.

## 2019-05-10 DIAGNOSIS — Z0001 Encounter for general adult medical examination with abnormal findings: Secondary | ICD-10-CM | POA: Diagnosis not present

## 2019-05-10 DIAGNOSIS — J4530 Mild persistent asthma, uncomplicated: Secondary | ICD-10-CM | POA: Diagnosis not present

## 2019-05-10 DIAGNOSIS — Z6827 Body mass index (BMI) 27.0-27.9, adult: Secondary | ICD-10-CM | POA: Diagnosis not present

## 2019-05-10 DIAGNOSIS — Z23 Encounter for immunization: Secondary | ICD-10-CM | POA: Diagnosis not present

## 2019-05-10 DIAGNOSIS — Z713 Dietary counseling and surveillance: Secondary | ICD-10-CM | POA: Diagnosis not present

## 2019-05-17 ENCOUNTER — Other Ambulatory Visit: Payer: Self-pay

## 2019-05-17 ENCOUNTER — Encounter: Payer: Self-pay | Admitting: Internal Medicine

## 2019-05-17 ENCOUNTER — Ambulatory Visit (INDEPENDENT_AMBULATORY_CARE_PROVIDER_SITE_OTHER): Payer: BC Managed Care – PPO | Admitting: Internal Medicine

## 2019-05-17 VITALS — Ht 65.0 in | Wt 164.0 lb

## 2019-05-17 DIAGNOSIS — Z13220 Encounter for screening for lipoid disorders: Secondary | ICD-10-CM | POA: Diagnosis not present

## 2019-05-17 DIAGNOSIS — Z862 Personal history of diseases of the blood and blood-forming organs and certain disorders involving the immune mechanism: Secondary | ICD-10-CM | POA: Diagnosis not present

## 2019-05-17 DIAGNOSIS — R5383 Other fatigue: Secondary | ICD-10-CM | POA: Diagnosis not present

## 2019-05-17 DIAGNOSIS — Z3042 Encounter for surveillance of injectable contraceptive: Secondary | ICD-10-CM

## 2019-05-17 DIAGNOSIS — Z3040 Encounter for surveillance of contraceptives, unspecified: Secondary | ICD-10-CM | POA: Insufficient documentation

## 2019-05-17 NOTE — Assessment & Plan Note (Signed)
Resolved per patient in Nov 2018.  Aggravated by post tonsillectomy hemorrhage

## 2019-05-17 NOTE — Assessment & Plan Note (Signed)
Needs routine labs and next injection to be scheduled

## 2019-05-17 NOTE — Progress Notes (Signed)
Virtual Visit converted to Telephone Visit   This visit type was conducted due to national recommendations for restrictions regarding the COVID-19 pandemic (e.g. social distancing).  This format is felt to be most appropriate for this patient at this time.  All issues noted in this document were discussed and addressed.  No physical exam was performed (except for noted visual exam findings with Video Visits).   I attempted to connect  with@ on 05/17/19 at 10:00 AM EDT by a video enabled telemedicine application.  Interactive audio and video telecommunications were attempted between this provider and patient, however failed, due to patient having technical difficulties .   We continued and completed visit with audio only..   verified that I am speaking with the correct person using two identifiers.   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 No known personal contacts.    The patient has no signs or symptoms of COVID 19 infection (fever, cough, sore throat  or shortness of breath beyond what is typical for patient).  Patient denies contact with other persons with the above mentioned symptoms or with anyone confirmed to have [place] generally well..  Takes Depo Provera quarterly for management of menstrual cycles and contraception,  Last injection was July 21 and she is due in early October.   History of anemia secondary post tonsillectomy hemorrhage in 2018,  Has not had labs since Nov 2018 when anemia was reportedly resolved.    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  Past Medical History:  Diagnosis Date   . Asthma     Past Surgical History:  Procedure Laterality Date  . NO PAST SURGERIES    . TONSILLECTOMY N/A 03/16/2017   Procedure: TONSILLECTOMY;  Surgeon: Margaretha Sheffield, MD;  Location: [facility];  Service: ENT;  Laterality: N/A;    Family History  Problem Relation Age of Onset  . Dementia Maternal Grandmother   . Breast cancer Paternal Grandmother     SOCIAL HX:  reports that she has never smoked. She has never used smokeless tobacco. She reports current alcohol use of about 1.0 standard drinks of alcohol per week. She reports that she does not use drugs.   Current Outpatient Medications:  .  albuterol (PROVENTIL HFA;VENTOLIN HFA) 108 (90 Base) MCG/ACT inhaler, Inhale into the lungs every 6 (six) hours as needed for wheezing or shortness of breath., Disp: , Rfl:  .  Ascorbic Acid (VITAMIN C) 100 MG tablet, Take 100 mg by mouth daily., Disp: , Rfl:  .  budesonide-formoterol (SYMBICORT) 160-4.5 MCG/ACT inhaler, Symbicort 160 mcg-4.5 mcg/actuation HFA aerosol inhaler, Disp: , Rfl:  .  cetirizine (ZYRTEC) 10 MG tablet, Take 10 mg by mouth daily., Disp: , Rfl:  .  MedroxyPROGESTERone Acetate (DEPO-PROVERA IM), Inject into the muscle., Disp: , Rfl:  .  Multiple Vitamin (MULTIVITAMIN) tablet, Take 1 tablet by mouth daily., Disp: , Rfl:   EXAM:  VITALS per patient if applicable:   General impression: alert, cooperative and articulate.  No signs of being in distress  Lungs: speech is fluent sentence length suggests that patient is not  short of breath and not punctuated by cough, sneezing or sniffing. Marland Kitchen   Psych: affect normal.  speech is articulate and non pressured .  Denies suicidal thoughts   ASSESSMENT AND PLAN:  Discussed the following assessment and plan:  Screening for hyperlipidemia - Plan: Lipid panel  History of iron deficiency anemia - Plan: CBC with Differential/Platelet  Encounter for surveillance of injectable contraceptive - Plan: Comprehensive metabolic  panel  Fatigue, unspecified type - Plan: TSH, POCT urine pregnancy  History of iron deficiency anemia Resolved per patient in Nov 2018.  Aggravated by post tonsillectomy hemorrhage   Contraception, generic surveillance Needs routine labs and next injection to be scheduled     I discussed the assessment and treatment plan with the patient. The patient was provided an opportunity to ask questions and all were answered. The patient agreed with the plan and demonstrated an understanding of the instructions.   The patient was advised to call back or seek an in-person evaluation if the symptoms worsen or if the condition fails to improve as anticipated.  I provided  22 minutes of non-face-to-face time during this encounter reviewing patient's current problems and post surgeries.  Providing counseling on the above mentioned problems , and coordination  of care . Sherlene Shams, MD

## 2019-05-23 ENCOUNTER — Telehealth: Payer: Self-pay

## 2019-05-23 DIAGNOSIS — Z862 Personal history of diseases of the blood and blood-forming organs and certain disorders involving the immune mechanism: Secondary | ICD-10-CM

## 2019-05-23 NOTE — Telephone Encounter (Signed)
Yes, iron levels  levels have been added

## 2019-05-23 NOTE — Telephone Encounter (Signed)
Copied from [place] [phone]. Topic: Appointment Scheduling - Scheduling Inquiry for Clinic >> May 23, 2019 11:25 AM Kimberly Moyer wrote: Patient would like to know if she can have iron levels checked at upcoming lab appointment on 10/19.

## 2019-05-27 ENCOUNTER — Ambulatory Visit (INDEPENDENT_AMBULATORY_CARE_PROVIDER_SITE_OTHER): Payer: BC Managed Care – PPO | Admitting: *Deleted

## 2019-05-27 ENCOUNTER — Other Ambulatory Visit (INDEPENDENT_AMBULATORY_CARE_PROVIDER_SITE_OTHER): Payer: BC Managed Care – PPO

## 2019-05-27 ENCOUNTER — Other Ambulatory Visit: Payer: Self-pay

## 2019-05-27 DIAGNOSIS — R5383 Other fatigue: Secondary | ICD-10-CM | POA: Diagnosis not present

## 2019-05-27 DIAGNOSIS — Z3042 Encounter for surveillance of injectable contraceptive: Secondary | ICD-10-CM

## 2019-05-27 DIAGNOSIS — Z13220 Encounter for screening for lipoid disorders: Secondary | ICD-10-CM

## 2019-05-27 DIAGNOSIS — Z862 Personal history of diseases of the blood and blood-forming organs and certain disorders involving the immune mechanism: Secondary | ICD-10-CM | POA: Diagnosis not present

## 2019-05-27 LAB — COMPREHENSIVE METABOLIC PANEL
ALT: 57 U/L — ABNORMAL HIGH (ref 0–35)
AST: 34 U/L (ref 0–37)
Albumin: 4.8 g/dL (ref 3.5–5.2)
Alkaline Phosphatase: 68 U/L (ref 39–117)
BUN: 14 mg/dL (ref 6–23)
CO2: 23 mEq/L (ref 19–32)
Calcium: 9.9 mg/dL (ref 8.4–10.5)
Chloride: 104 mEq/L (ref 96–112)
Creatinine, Ser: 0.72 mg/dL (ref 0.40–1.20)
GFR: 101.37 mL/min (ref >60.00)
Glucose, Bld: 90 mg/dL (ref 70–99)
Potassium: 3.8 mEq/L (ref 3.5–5.1)
Sodium: 137 mEq/L (ref 135–145)
Total Bilirubin: 0.5 mg/dL (ref 0.2–1.2)
Total Protein: 7.3 g/dL (ref 6.0–8.3)

## 2019-05-27 LAB — CBC WITH DIFFERENTIAL/PLATELET
Basophils Absolute: 0.1 10*3/uL (ref 0.0–0.1)
Basophils Relative: 0.7 % (ref 0.0–3.0)
Eosinophils Absolute: 0.2 10*3/uL (ref 0.0–0.7)
Eosinophils Relative: 3.1 % (ref 0.0–5.0)
HCT: 43.8 % (ref 36.0–46.0)
Hemoglobin: 15.1 g/dL — ABNORMAL HIGH (ref 12.0–15.0)
Lymphocytes Relative: 25.6 % (ref 12.0–46.0)
Lymphs Abs: 1.9 10*3/uL (ref 0.7–4.0)
MCHC: 34.5 g/dL (ref 30.0–36.0)
MCV: 89.6 fl (ref 78.0–100.0)
Monocytes Absolute: 0.5 10*3/uL (ref 0.1–1.0)
Monocytes Relative: 7.3 % (ref 3.0–12.0)
Neutro Abs: 4.7 10*3/uL (ref 1.4–7.7)
Neutrophils Relative %: 63.3 % (ref 43.0–77.0)
Platelets: 223 10*3/uL (ref 150.0–400.0)
RBC: 4.89 Mil/uL (ref 3.87–5.11)
RDW: 12.8 % (ref 11.5–15.5)
WBC: 7.5 10*3/uL (ref 4.0–10.5)

## 2019-05-27 LAB — IBC + FERRITIN
Ferritin: 56.9 ng/mL (ref 10.0–291.0)
Iron: 96 ug/dL (ref 42–145)
Saturation Ratios: 23.2 % (ref 20.0–50.0)
Transferrin: 295 mg/dL (ref 212.0–360.0)

## 2019-05-27 LAB — POCT URINE PREGNANCY: Preg Test, Ur: NEGATIVE

## 2019-05-27 LAB — LIPID PANEL
Cholesterol: 166 mg/dL (ref 0–200)
HDL: 61 mg/dL (ref >39.00)
LDL Cholesterol: 94 mg/dL (ref 0–99)
NonHDL: 104.54
Total CHOL/HDL Ratio: 3
Triglycerides: 51 mg/dL (ref 0.0–149.0)
VLDL: 10.2 mg/dL (ref 0.0–40.0)

## 2019-05-27 LAB — TSH: TSH: 1.42 u[IU]/mL (ref 0.35–4.50)

## 2019-05-27 MED ORDER — MEDROXYPROGESTERONE ACETATE 150 MG/ML IM SUSP
150.0000 mg | Freq: Once | INTRAMUSCULAR | Status: AC
Start: 2019-05-27 — End: 2019-05-27
  Administered 2019-05-27: 150 mg via INTRAMUSCULAR

## 2019-05-27 NOTE — Progress Notes (Signed)
Depo-Provera given after negative pregnancy test, in the upper right outer quadrant. Patient voiced no concerns or showed any sign of distress during injection.

## 2019-05-28 ENCOUNTER — Other Ambulatory Visit: Payer: Self-pay | Admitting: Internal Medicine

## 2019-05-28 DIAGNOSIS — R7401 Elevation of levels of liver transaminase levels: Secondary | ICD-10-CM

## 2019-06-03 ENCOUNTER — Telehealth: Payer: Self-pay

## 2019-06-03 NOTE — Telephone Encounter (Signed)
Advised yes we can do this.

## 2019-06-03 NOTE — Telephone Encounter (Signed)
Copied from [place] [id number]. Topic: General - Other >> Jun 03, 2019  2:36 PM Lennox Solders wrote: Reason for CRM:pt mom is calling and her daughter had depo  provera shot on 05-27-2019. Pt brought her own depo provera into office and the office use theirs instead.. Pt mom would like to know if we can just administer to depo shot only since express script send medication in the mail to patient. Pt will come in office to sign DPR on wednesday

## 2019-06-05 ENCOUNTER — Other Ambulatory Visit: Payer: Self-pay

## 2019-06-05 ENCOUNTER — Other Ambulatory Visit (INDEPENDENT_AMBULATORY_CARE_PROVIDER_SITE_OTHER): Payer: BC Managed Care – PPO

## 2019-06-05 DIAGNOSIS — R7401 Elevation of levels of liver transaminase levels: Secondary | ICD-10-CM | POA: Diagnosis not present

## 2019-06-05 LAB — HEPATIC FUNCTION PANEL
ALT: 67 U/L — ABNORMAL HIGH (ref 0–35)
AST: 127 U/L — ABNORMAL HIGH (ref 0–37)
Albumin: 4.8 g/dL (ref 3.5–5.2)
Alkaline Phosphatase: 67 U/L (ref 39–117)
Bilirubin, Direct: 0.1 mg/dL (ref 0.0–0.3)
Total Bilirubin: 0.5 mg/dL (ref 0.2–1.2)
Total Protein: 7.4 g/dL (ref 6.0–8.3)

## 2019-06-07 LAB — IBC + FERRITIN
Ferritin: 48.8 ng/mL (ref 10.0–291.0)
Iron: 112 ug/dL (ref 42–145)
Saturation Ratios: 27.6 % (ref 20.0–50.0)
Transferrin: 290 mg/dL (ref 212.0–360.0)

## 2019-06-07 LAB — MITOCHONDRIAL ANTIBODIES: Mitochondrial M2 Ab, IgG: <=20 U

## 2019-06-07 LAB — HEPATITIS C ANTIBODY
Hepatitis C Ab: NONREACTIVE
SIGNAL TO CUT-OFF: 0.01 (ref <1.00)

## 2019-06-07 LAB — ANTI-NUCLEAR AB-TITER (ANA TITER): ANA Titer 1: 1:80 {titer} — ABNORMAL HIGH

## 2019-06-07 LAB — ANTI-SMITH ANTIBODY: ENA SM Ab Ser-aCnc: <1 AI

## 2019-06-07 LAB — HEPATITIS B SURFACE ANTIBODY,QUALITATIVE: Hep B S Ab: REACTIVE — AB

## 2019-06-07 LAB — ANA: Anti Nuclear Antibody (ANA): POSITIVE — AB

## 2019-06-07 LAB — HEPATITIS B SURFACE ANTIGEN: Hepatitis B Surface Ag: NONREACTIVE

## 2019-06-07 LAB — HEPATITIS B CORE ANTIBODY, TOTAL: Hep B Core Total Ab: NONREACTIVE

## 2019-06-09 ENCOUNTER — Other Ambulatory Visit: Payer: Self-pay | Admitting: Internal Medicine

## 2019-06-12 ENCOUNTER — Ambulatory Visit (INDEPENDENT_AMBULATORY_CARE_PROVIDER_SITE_OTHER): Payer: BC Managed Care – PPO | Admitting: Internal Medicine

## 2019-06-12 ENCOUNTER — Encounter: Payer: Self-pay | Admitting: Internal Medicine

## 2019-06-12 ENCOUNTER — Other Ambulatory Visit: Payer: Self-pay

## 2019-06-12 VITALS — Ht 65.0 in | Wt 160.0 lb

## 2019-06-12 DIAGNOSIS — R768 Other specified abnormal immunological findings in serum: Secondary | ICD-10-CM | POA: Diagnosis not present

## 2019-06-12 DIAGNOSIS — R748 Abnormal levels of other serum enzymes: Secondary | ICD-10-CM | POA: Diagnosis not present

## 2019-06-12 DIAGNOSIS — K754 Autoimmune hepatitis: Secondary | ICD-10-CM | POA: Insufficient documentation

## 2019-06-12 NOTE — Progress Notes (Signed)
Virtual Visit via [email or web address]  This visit type was conducted due to national recommendations for restrictions regarding the COVID-19 pandemic (e.g. social distancing).  This format is felt to be most appropriate for this patient at this time.  All issues noted in this document were discussed and addressed.  No physical exam was performed (except for noted visual exam findings with Video Visits).   I connected with@ on 06/12/19 at  9:30 AM EST by a video enabled telemedicine application and verified that I am speaking with the correct person using two identifiers. Location patient: home Location provider: work or home office Persons participating in the virtual visit: patient, provider  I discussed the limitations, risks, security and privacy concerns of performing an evaluation and management service by telephone and the availability of in person appointments. I also discussed with the patient that there may be a patient responsible charge related to this service. The patient expressed understanding and agreed to proceed.  Reason for visit: follow up on labs   HPI:  23 yr old female here for follow up on  Abnormal liver enzymes and positive ANA TITER OF 1:80    She denies any symptoms of hepatitis:  No nausea,  Pruritus jaundice, etc  Does not use alcohol or tylenol on a regular basis   Has been using Depo Prover for contraception for the last 4 years    ROS: See pertinent positives and negatives per HPI.  Past Medical History:  Diagnosis Date  . Asthma     Past Surgical History:  Procedure Laterality Date  . NO PAST SURGERIES    . TONSILLECTOMY N/A 03/16/2017   Procedure: TONSILLECTOMY;  Surgeon: Margaretha Sheffield, MD;  Location: [facility];  Service: ENT;  Laterality: N/A;    Family History  Problem Relation Age of Onset  . Dementia Maternal Grandmother   . Breast cancer Paternal Grandmother     SOCIAL HX:  reports that she has never smoked. She has never used smokeless  tobacco. She reports current alcohol use of about 1.0 standard drinks of alcohol per week. She reports that she does not use drugs.   Current Outpatient Medications:  .  albuterol (PROVENTIL HFA;VENTOLIN HFA) 108 (90 Base) MCG/ACT inhaler, Inhale into the lungs every 6 (six) hours as needed for wheezing or shortness of breath., Disp: , Rfl:  .  Ascorbic Acid (VITAMIN C) 100 MG tablet, Take 100 mg by mouth daily., Disp: , Rfl:  .  budesonide-formoterol (SYMBICORT) 160-4.5 MCG/ACT inhaler, Symbicort 160 mcg-4.5 mcg/actuation HFA aerosol inhaler, Disp: , Rfl:  .  cetirizine (ZYRTEC) 10 MG tablet, Take 10 mg by mouth daily., Disp: , Rfl:  .  MedroxyPROGESTERone Acetate (DEPO-PROVERA IM), Inject into the muscle., Disp: , Rfl:   EXAM:  VITALS per patient if applicable:  GENERAL: alert, oriented, appears well and in no acute distress  HEENT: atraumatic, conjunttiva clear, no obvious abnormalities on inspection of external nose and ears  NECK: normal movements of the head and neck  LUNGS: on inspection no signs of respiratory distress, breathing rate appears normal, no obvious gross SOB, gasping or wheezing  CV: no obvious cyanosis  MS: moves all visible extremities without noticeable abnormality  PSYCH/NEURO: pleasant and cooperative, no obvious depression or anxiety, speech and thought processing grossly intact  ASSESSMENT AND PLAN:  Discussed the following assessment and plan:  Positive ANA (antinuclear antibody) - Plan: Ambulatory referral to Gastroenterology  Elevated liver enzymes - Plan: Ambulatory referral to Gastroenterology  Autoimmune hepatitis ([place])  Autoimmune hepatitis (HCC) Suspected, based on elevated liver enzymes and positive ANA>  Referral to [facility] in progress.  Advised to abstain from alcohol,  Tylenol and NSAIDS    I discussed the assessment and treatment plan with the patient. The patient was provided an opportunity to ask questions and all were answered.  The patient agreed with the plan and demonstrated an understanding of the instructions.   The patient was advised to call back or seek an in-person evaluation if the symptoms worsen or if the condition fails to improve as anticipated.  I provided  15 minutes of non-face-to-face time during this encounter.   Sherlene Shams, MD

## 2019-06-12 NOTE — Assessment & Plan Note (Signed)
Suspected, based on elevated liver enzymes and positive ANA>  Referral to [facility] in progress.  Advised to abstain from alcohol,  Tylenol and NSAIDS

## 2019-06-13 ENCOUNTER — Ambulatory Visit: Payer: BC Managed Care – PPO | Admitting: Gastroenterology

## 2019-06-13 ENCOUNTER — Encounter: Payer: Self-pay | Admitting: Gastroenterology

## 2019-06-13 VITALS — BP 131/62 | HR 83 | Temp 98.1°F | Ht 65.0 in | Wt 166.6 lb

## 2019-06-13 DIAGNOSIS — R7989 Other specified abnormal findings of blood chemistry: Secondary | ICD-10-CM

## 2019-06-13 DIAGNOSIS — R945 Abnormal results of liver function studies: Secondary | ICD-10-CM | POA: Diagnosis not present

## 2019-06-13 NOTE — Progress Notes (Signed)
Wyline Mood MD, MRCP(U.K) [address]  [address]  [place], [place] [address]  Main: [phone]  Fax: [phone]   Gastroenterology Consultation  Referring Provider:     Sherlene Shams, MD Primary Care Physician:  Sherlene Shams, MD Primary Gastroenterologist:  Dr. Wyline Mood  Reason for Consultation:     Abnormal LFTs        HPI:   Kimberly Moyer is a 23 y.o. y/o female referred for consultation & management  by Dr. Sherlene Shams, MD.    Initially noted to be elevated 05/17/2019.  Isolated elevation of ALT at 57.  2 years back it was elevated at 105 withan AST of 70  She had further work-up performed by Dr. Darrick Huntsman that showed.  Iron studies, TSH, CBC, AMA, hepatitis C antibody, hepatitis B surface antigen were negative or normal.  Hepatitis B surface antibody is reactive suggestive of immunity.  Hepatitis B core total antibody is nonreactive.  When transaminases were checked again 8 days back her AST was 127 with an ALT of 67.  She says that 2 years back she was sick with a viral infection and the abnormal liver function tests were attributed to the same.  She is in college, had a few drinks per week.  She cannot exactly recall that the number of days prior to the blood test and the number drink she consumed.  Probably had a few drinks a few days prior to the blood tests.  She works out extensively high intensity.  She does recollect she had a significant muscle pain after working out.  Denies any tattoos, illegal drug use, over-the-counter medications, excess Tylenol.  She takes a birth control shot.  She has had history of right upper quadrant pain on and off probably once a month, sometimes after a meal sharp in nature, nonradiating, lasting for up to a few hours.  At times has been worse after a fatty meal.  Her grandmother, mother, aunt all had to have the gallbladder taken out due to gallstones.  Past Medical History:  Diagnosis Date  . Asthma     Past Surgical  History:  Procedure Laterality Date  . NO PAST SURGERIES    . TONSILLECTOMY N/A 03/16/2017   Procedure: TONSILLECTOMY;  Surgeon: Vernie Murders, MD;  Location: [facility];  Service: ENT;  Laterality: N/A;    Prior to Admission medications   Medication Sig Start Date End Date Taking? Authorizing Provider  albuterol (PROVENTIL HFA;VENTOLIN HFA) 108 (90 Base) MCG/ACT inhaler Inhale into the lungs every 6 (six) hours as needed for wheezing or shortness of breath.    [provider]  Ascorbic Acid (VITAMIN C) 100 MG tablet Take 100 mg by mouth daily.    [provider]  budesonide-formoterol (SYMBICORT) 160-4.5 MCG/ACT inhaler Symbicort 160 mcg-4.5 mcg/actuation HFA aerosol inhaler    [provider]  cetirizine (ZYRTEC) 10 MG tablet Take 10 mg by mouth daily.    [provider]  MedroxyPROGESTERone Acetate (DEPO-PROVERA IM) Inject into the muscle.    [provider]    Family History  Problem Relation Age of Onset  . Dementia Maternal Grandmother   . Breast cancer Paternal Grandmother      Social History   Tobacco Use  . Smoking status: Never Smoker  . Smokeless tobacco: Never Used  Substance Use Topics  . Alcohol use: Yes    Alcohol/week: 1.0 standard drinks    Types: 1 Cans of beer per week  .  Drug use: Never    Allergies as of 06/13/2019 - Review Complete 06/12/2019  Allergen Reaction Noted  . Banana Itching 03/08/2017  . Carrot [daucus carota] Itching 03/08/2017  . Mushroom extract complex Itching 03/08/2017  . Watermelon [citrullus vulgaris] Itching 03/08/2017    Review of Systems:    All systems reviewed and negative except where noted in HPI.   Physical Exam:  There were no vitals taken for this visit. No LMP recorded. Patient has had an injection. Psych:  Alert and cooperative. Normal mood and affect. General:   Alert,  Well-developed, well-nourished, pleasant and cooperative in NAD Head:  Normocephalic and  atraumatic. Eyes:  Sclera clear, no icterus.   Conjunctiva pink. Ears:  Normal auditory acuity. Nose:  No deformity, discharge, or lesions. Mouth:  No deformity or lesions,oropharynx pink & moist. Neck:  Supple; no masses or thyromegaly. Lungs:  Respirations even and unlabored.  Clear throughout to auscultation.   No wheezes, crackles, or rhonchi. No acute distress. Heart:  Regular rate and rhythm; no murmurs, clicks, rubs, or gallops. Abdomen:  Normal bowel sounds.  No bruits.  Soft, non-tender and non-distended without masses, hepatosplenomegaly or hernias noted.  No guarding or rebound tenderness.    Neurologic:  Alert and oriented x3;  grossly normal neurologically. Skin:  Intact without significant lesions or rashes. No jaundice. Lymph Nodes:  No significant cervical adenopathy. Psych:  Alert and cooperative. Normal mood and affect.  Imaging Studies: No results found.  Assessment and Plan:   Kimberly Moyer is a 23 y.o. y/o female has been referred for abnormal transaminases.  Positive ANA.  Dr. Derrel Nip had commenced work-up for autoimmune hepatitis and viral hepatitis.  She has a history of intensive exercise which sometimes can cause inflammation of the muscles and lead to elevation of both AST and ALT.  In combination with the effects of alcohol might have caused the transaminitis.  There are a few tests missing which I will complete.  Generally the ANA is nonspecific and based on all the results I get back we can determine what the next up would be.  I would also get a right upper quadrant ultrasound to rule out any biliary obstruction, biliary sludge, stones.  She has some history of biliary colic which will follow up over the next few months and if we think that she definitely has the same may end up having a cholecystectomy at some point in the future.  She has a strong family history of issues with her gallstones..  Plan 1.  Right upper quadrant ultrasound 2.  Complete missing test  to evaluate for autoimmune liver disease 3.  Avoid all alcohol from today. 4.  Repeat LFTs in 3 weeks, avoid intensive exercise for at least a week prior to lab draws in 3 weeks. 5.  Avoid excess Tylenol, over-the-counter medications  Follow up in 3 weeks  Dr Jonathon Bellows MD,MRCP(U.K)

## 2019-06-15 LAB — IMMUNOGLOBULINS A/E/G/M, SERUM
IgA/Immunoglobulin A, Serum: 168 mg/dL (ref 87–352)
IgE (Immunoglobulin E), Serum: 353 IU/mL (ref 6–495)
IgG (Immunoglobin G), Serum: 669 mg/dL (ref 586–1602)
IgM (Immunoglobulin M), Srm: 102 mg/dL (ref 26–217)

## 2019-06-15 LAB — HEPATITIS B E ANTIGEN: Hep B E Ag: NEGATIVE

## 2019-06-15 LAB — CELIAC DISEASE AB SCREEN W/RFX
Antigliadin Abs, IgA: 4 units (ref 0–19)
Transglutaminase IgA: <2 U/mL (ref 0–3)

## 2019-06-15 LAB — ANTI-MICROSOMAL ANTIBODY LIVER / KIDNEY: LKM1 Ab: 0.9 Units (ref 0.0–20.0)

## 2019-06-15 LAB — CERULOPLASMIN: Ceruloplasmin: 26.8 mg/dL (ref 19.0–39.0)

## 2019-06-15 LAB — HEPATITIS B CORE ANTIBODY, TOTAL: Hep B Core Total Ab: NEGATIVE

## 2019-06-15 LAB — GAMMA GT: GGT: 38 IU/L (ref 0–60)

## 2019-06-15 LAB — ALPHA-1-ANTITRYPSIN: A-1 Antitrypsin: 133 mg/dL (ref 100–188)

## 2019-06-15 LAB — CK: Total CK: 175 U/L (ref 32–182)

## 2019-06-15 LAB — HEPATITIS A ANTIBODY, TOTAL: hep A Total Ab: POSITIVE — AB

## 2019-06-15 LAB — MITOCHONDRIAL/SMOOTH MUSCLE AB PNL
Mitochondrial Ab: <20 Units (ref 0.0–20.0)
Smooth Muscle Ab: 3 Units (ref 0–19)

## 2019-06-15 LAB — HIV ANTIBODY (ROUTINE TESTING W REFLEX): HIV Screen 4th Generation wRfx: NONREACTIVE

## 2019-06-15 LAB — HEPATITIS B E ANTIBODY: Hep B E Ab: NEGATIVE

## 2019-06-20 ENCOUNTER — Telehealth: Payer: Self-pay

## 2019-06-20 NOTE — Telephone Encounter (Signed)
Pt mother and pt called to request lab results I informed pt of results and explained that Dr. Vicente Males will review the results and if he has recommendations I will inform pt.

## 2019-06-21 ENCOUNTER — Other Ambulatory Visit: Payer: Self-pay

## 2019-06-21 ENCOUNTER — Ambulatory Visit
Admission: RE | Admit: 2019-06-21 | Discharge: 2019-06-21 | Disposition: A | Discharge status: Home / Self Care | Payer: BC Managed Care – PPO | Source: Ambulatory Visit | Attending: Gastroenterology | Admitting: Gastroenterology

## 2019-06-21 ENCOUNTER — Encounter: Payer: Self-pay | Admitting: Gastroenterology

## 2019-06-21 ENCOUNTER — Telehealth: Payer: Self-pay

## 2019-06-21 DIAGNOSIS — R7989 Other specified abnormal findings of blood chemistry: Secondary | ICD-10-CM

## 2019-06-21 DIAGNOSIS — R945 Abnormal results of liver function studies: Secondary | ICD-10-CM

## 2019-06-21 NOTE — Telephone Encounter (Signed)
All labs checked were negative including GGT which shows no active inflammation

## 2019-06-21 NOTE — Progress Notes (Signed)
Inform its normal

## 2019-06-21 NOTE — Telephone Encounter (Signed)
Spoke with pt and informed her that her ultrasound resulted normal.

## 2019-06-21 NOTE — Telephone Encounter (Signed)
-----   Message from Jonathon Bellows, MD sent at 06/21/2019 10:37 AM EST ----- Inform its normal

## 2019-06-21 NOTE — Telephone Encounter (Signed)
Spoke with pt and her mother and informed them all test were negative and indicate no active inflammation. Pt understands.

## 2019-07-02 ENCOUNTER — Telehealth: Payer: Self-pay

## 2019-07-02 NOTE — Telephone Encounter (Signed)
Pt mother called requesting Dr. Georgeann Oppenheim plan after pt has completed her repeat LFT labs, pt wants to know if she'll be able to resume exercise and occasional drinking. I spoke with pt and informed her, per Dr. Vicente Males, if the repeat lab results are normal pt is okay to resume regular exercise and drinking moderately. Pt understands and agrees.

## 2019-07-03 DIAGNOSIS — R945 Abnormal results of liver function studies: Secondary | ICD-10-CM | POA: Diagnosis not present

## 2019-07-04 LAB — HEPATIC FUNCTION PANEL
ALT: 26 IU/L (ref 0–32)
AST: 20 IU/L (ref 0–40)
Albumin: 5 g/dL (ref 3.9–5.0)
Alkaline Phosphatase: 80 IU/L (ref 39–117)
Bilirubin Total: 0.5 mg/dL (ref 0.0–1.2)
Bilirubin, Direct: 0.13 mg/dL (ref 0.00–0.40)
Total Protein: 7.1 g/dL (ref 6.0–8.5)

## 2019-07-04 LAB — GAMMA GT: GGT: 38 IU/L (ref 0–60)

## 2019-07-07 ENCOUNTER — Encounter: Payer: Self-pay | Admitting: Gastroenterology

## 2019-07-09 ENCOUNTER — Telehealth: Payer: Self-pay

## 2019-07-09 DIAGNOSIS — Z20828 Contact with and (suspected) exposure to other viral communicable diseases: Secondary | ICD-10-CM | POA: Diagnosis not present

## 2019-07-09 NOTE — Telephone Encounter (Signed)
Spoke with pt and informed her of lab results and Dr. Georgeann Oppenheim recommendations. Pt agrees.

## 2019-07-09 NOTE — Telephone Encounter (Signed)
-----   Message from Jonathon Bellows, MD sent at 07/07/2019  8:50 AM EST ----- Kimberly Moyer   Please inform patient that her LFTs have completely normalized.  The acute rise in drop could be from the effects of alcohol.  Can return to drinking alcohol but in moderation.  Suggest recheck LFTs in 3 to 4 months.  Can resume exercising.  C/c Crecencio Mc, MD   Dr Jonathon Bellows MD,MRCP [facility]) Gastroenterology/Hepatology Pager: [phone]

## 2019-07-11 ENCOUNTER — Ambulatory Visit: Payer: BC Managed Care – PPO | Admitting: Gastroenterology

## 2019-07-16 DIAGNOSIS — Z20828 Contact with and (suspected) exposure to other viral communicable diseases: Secondary | ICD-10-CM | POA: Diagnosis not present

## 2019-07-23 ENCOUNTER — Ambulatory Visit: Payer: BC Managed Care – PPO | Admitting: Internal Medicine

## 2019-08-07 ENCOUNTER — Other Ambulatory Visit: Payer: Self-pay

## 2019-08-12 ENCOUNTER — Telehealth: Payer: Self-pay | Admitting: Internal Medicine

## 2019-08-12 NOTE — Telephone Encounter (Signed)
Pt's mother has a question about birth control shot. Patient has an appt tomorrow morning. Please advise

## 2019-08-13 ENCOUNTER — Other Ambulatory Visit: Payer: Self-pay

## 2019-08-13 ENCOUNTER — Ambulatory Visit (INDEPENDENT_AMBULATORY_CARE_PROVIDER_SITE_OTHER): Payer: BC Managed Care – PPO | Admitting: Lab

## 2019-08-13 DIAGNOSIS — Z3042 Encounter for surveillance of injectable contraceptive: Secondary | ICD-10-CM | POA: Diagnosis not present

## 2019-08-13 DIAGNOSIS — Z793 Long term (current) use of hormonal contraceptives: Secondary | ICD-10-CM

## 2019-08-13 MED ORDER — MEDROXYPROGESTERONE ACETATE 150 MG/ML IM SUSP
150.0000 mg | Freq: Once | INTRAMUSCULAR | Status: AC
  Administered 2019-08-13: 09:00:00 150 mg via INTRAMUSCULAR

## 2019-08-13 NOTE — Telephone Encounter (Signed)
Spoke with pt's mother and she stated that the last two times that the pt has came in the office to get her depo-provera shot she has brought her medication but the nurse would not use it because she had already opened one up from the office and didn't want to waste it. Mother asked if she was being charged for the injection I explained to her that since she was given the medication from our office she would be charged for the medication and for the administration fee. The mother wanted to know if the insurance company paid for it, I told her that she would have to call her insurance company. Mother stated that she would call and then give [place] a call back to let us know what they say.

## 2019-08-13 NOTE — Progress Notes (Addendum)
Pt in office today for Depo injection on L- gluteus maximus. Pt tolerated well. Pt can return for next injection 3/23-4/6  Reviewed.  Dr Lorin Picket

## 2019-08-30 DIAGNOSIS — R5383 Other fatigue: Secondary | ICD-10-CM

## 2019-09-04 NOTE — Telephone Encounter (Signed)
Left message for patient to return call to office due to patient needs appointment with HX of low hemoglobin and Iron deficient anemia needs triage to any other symptoms. Needs virtual and labs.

## 2019-09-05 ENCOUNTER — Other Ambulatory Visit: Payer: Self-pay | Admitting: Internal Medicine

## 2019-09-05 DIAGNOSIS — R5383 Other fatigue: Secondary | ICD-10-CM

## 2019-09-05 NOTE — Telephone Encounter (Signed)
Pt mom called about pt needing labs. Order needs to say Lab collect. Pt is in [place].   Call pt @ [phone].

## 2019-09-09 DIAGNOSIS — R5383 Other fatigue: Secondary | ICD-10-CM | POA: Diagnosis not present

## 2019-09-09 NOTE — Telephone Encounter (Signed)
Labs have been reordered for [facility] and pt is aware.

## 2019-09-10 ENCOUNTER — Other Ambulatory Visit: Payer: Self-pay | Admitting: Internal Medicine

## 2019-09-10 DIAGNOSIS — D751 Secondary polycythemia: Secondary | ICD-10-CM

## 2019-09-10 LAB — CBC WITH DIFFERENTIAL/PLATELET
Basophils Absolute: 0.1 x10E3/uL (ref 0.0–0.2)
Basos: 1 %
EOS (ABSOLUTE): 0.3 x10E3/uL (ref 0.0–0.4)
Eos: 3 %
Hematocrit: 46.5 % (ref 34.0–46.6)
Hemoglobin: 16.6 g/dL — ABNORMAL HIGH (ref 11.1–15.9)
Immature Grans (Abs): 0 x10E3/uL (ref 0.0–0.1)
Immature Granulocytes: 0 %
Lymphocytes Absolute: 3.2 x10E3/uL — ABNORMAL HIGH (ref 0.7–3.1)
Lymphs: 31 %
MCH: 30.8 pg (ref 26.6–33.0)
MCHC: 35.7 g/dL (ref 31.5–35.7)
MCV: 86 fL (ref 79–97)
Monocytes Absolute: 0.8 x10E3/uL (ref 0.1–0.9)
Monocytes: 8 %
Neutrophils Absolute: 5.8 x10E3/uL (ref 1.4–7.0)
Neutrophils: 57 %
Platelets: 283 x10E3/uL (ref 150–450)
RBC: 5.39 x10E6/uL — ABNORMAL HIGH (ref 3.77–5.28)
RDW: 12.2 % (ref 11.7–15.4)
WBC: 10.2 x10E3/uL (ref 3.4–10.8)

## 2019-09-10 LAB — TSH: TSH: 1.54 u[IU]/mL (ref 0.450–4.500)

## 2019-09-16 ENCOUNTER — Ambulatory Visit (INDEPENDENT_AMBULATORY_CARE_PROVIDER_SITE_OTHER): Payer: BC Managed Care – PPO | Admitting: Internal Medicine

## 2019-09-16 ENCOUNTER — Other Ambulatory Visit: Payer: Self-pay

## 2019-09-16 ENCOUNTER — Encounter: Payer: Self-pay | Admitting: Internal Medicine

## 2019-09-16 DIAGNOSIS — D751 Secondary polycythemia: Secondary | ICD-10-CM | POA: Diagnosis not present

## 2019-09-16 DIAGNOSIS — R6889 Other general symptoms and signs: Secondary | ICD-10-CM | POA: Diagnosis not present

## 2019-09-16 NOTE — Progress Notes (Signed)
Virtual Visit via [email or web address]  This visit type was conducted due to national recommendations for restrictions regarding the COVID-19 pandemic (e.g. social distancing).  This format is felt to be most appropriate for this patient at this time.  All issues noted in this document were discussed and addressed.  No physical exam was performed (except for noted visual exam findings with Video Visits).   I connected with@ on 09/16/19 at  9:00 AM EST by a video enabled telemedicine application  and verified that I am speaking with the correct person using two identifiers. Location patient: home Location provider: work or home office Persons participating in the virtual visit: patient, provider  I discussed the limitations, risks, security and privacy concerns of performing an evaluation and management service by telephone and the availability of in person appointments. I also discussed with the patient that there may be a patient responsible charge related to this service. The patient expressed understanding and agreed to proceed.  Reason for visit: post COVID SYNDROME   HPI:  24 yr old female diagnosed with COVID INFECTION DEC 2 WITH COUGH and anosmia, had a mild case during the quarantine period. on Day 14 developed fatigue.   Still has anosmia,  But her fatigue has resolved and she has been back to work full time for the past 3 weeks.  New onset cold intolerance that is profound. Has not measured her temperature, but feels cold in a 74 degree heated room.  Nose gets cold,  Toes not cold.  Not exercising   2) erythrocytosis .  Has not had a menses in 4 years.  Does not donate blood.  Using Depo Provera .  No known family history of HH    ROS: See pertinent positives and negatives per HPI.  Past Medical History:  Diagnosis Date  . Asthma     Past Surgical History:  Procedure Laterality Date  . NO PAST SURGERIES    . TONSILLECTOMY N/A 03/16/2017   Procedure: TONSILLECTOMY;  Surgeon: Margaretha Sheffield,  MD;  Location: [facility];  Service: ENT;  Laterality: N/A;    Family History  Problem Relation Age of Onset  . Dementia Maternal Grandmother   . Breast cancer Paternal Grandmother     SOCIAL HX:  reports that she has never smoked. She has never used smokeless tobacco. She reports current alcohol use of about 1.0 standard drinks of alcohol per week. She reports that she does not use drugs.   Current Outpatient Medications:  .  albuterol (PROVENTIL HFA;VENTOLIN HFA) 108 (90 Base) MCG/ACT inhaler, Inhale into the lungs every 6 (six) hours as needed for wheezing or shortness of breath., Disp: , Rfl:  .  Ascorbic Acid (VITAMIN C) 100 MG tablet, Take 100 mg by mouth daily., Disp: , Rfl:  .  budesonide-formoterol (SYMBICORT) 160-4.5 MCG/ACT inhaler, Symbicort 160 mcg-4.5 mcg/actuation HFA aerosol inhaler, Disp: , Rfl:  .  cetirizine (ZYRTEC) 10 MG tablet, Take 10 mg by mouth daily., Disp: , Rfl:  .  cholecalciferol (VITAMIN D3) 25 MCG (1000 UNIT) tablet, Take 1,000 Units by mouth daily., Disp: , Rfl:  .  MedroxyPROGESTERone Acetate (DEPO-PROVERA IM), Inject into the muscle., Disp: , Rfl:   EXAM:  VITALS per patient if applicable:  GENERAL: alert, oriented, appears well and in no acute distress  HEENT: atraumatic, conjunttiva clear, no obvious abnormalities on inspection of external nose and ears  NECK: normal movements of the head and neck  LUNGS: on inspection no signs of respiratory distress, breathing rate  appears normal, no obvious gross SOB, gasping or wheezing  CV: no obvious cyanosis  MS: moves all visible extremities without noticeable abnormality  PSYCH/NEURO: pleasant and cooperative, no obvious depression or anxiety, speech and thought processing grossly intact  ASSESSMENT AND PLAN:  Discussed the following assessment and plan:  Erythrocytosis  Cold intolerance  Erythrocytosis New onset,  Etiology unclear.  Given her history of IDA<  Iron overload unlikely  but will  Check iron stores along with EPO level.   Cold intolerance likely due to POST COVID SYNDROME (diagnosed in December,  Still reporting anosmia as well,  But no fatigue).  Since hr fatigue has resolved, I ecouraged her to exercise  Thyroid function has been checked and found to be normal     I discussed the assessment and treatment plan with the patient. The patient was provided an opportunity to ask questions and all were answered. The patient agreed with the plan and demonstrated an understanding of the instructions.   The patient was advised to call back or seek an in-person evaluation if the symptoms worsen or if the condition fails to improve as anticipated.  I provided  30 minutes of non-face-to-face time during this encounter reviewing patient's current problems and post surgeries.  Providing counseling on the above mentioned problems , and coordination  of care .  Sherlene Shams, MD

## 2019-09-16 NOTE — Assessment & Plan Note (Signed)
likely due to POST COVID SYNDROME (diagnosed in December,  Still reporting anosmia as well,  But no fatigue).  Since hr fatigue has resolved, I ecouraged her to exercise  Thyroid function has been checked and found to be normal

## 2019-09-16 NOTE — Assessment & Plan Note (Signed)
New onset,  Etiology unclear.  Given her history of IDA<  Iron overload unlikely but will  Check iron stores along with EPO level.

## 2019-10-09 ENCOUNTER — Other Ambulatory Visit: Payer: Self-pay

## 2019-10-09 DIAGNOSIS — R7989 Other specified abnormal findings of blood chemistry: Secondary | ICD-10-CM

## 2019-10-09 DIAGNOSIS — R945 Abnormal results of liver function studies: Secondary | ICD-10-CM

## 2019-10-10 NOTE — Telephone Encounter (Signed)
Pt left vm she just moved to [place] and would like to have her Labs drawn there please call pt

## 2019-10-11 NOTE — Telephone Encounter (Signed)
Spoke with pt and informed her that we could fax the lab test order to a [facility] collection site in her local area. Pt agreed and informed me of the contact information to her local [facility], I called the [facility] patient service center in [place], [place] [phone]) to confirm they will accept a faxed order. Order has been faxed.

## 2019-10-17 DIAGNOSIS — R945 Abnormal results of liver function studies: Secondary | ICD-10-CM | POA: Diagnosis not present

## 2019-10-18 LAB — HEPATIC FUNCTION PANEL
ALT: 21 IU/L (ref 0–32)
AST: 20 IU/L (ref 0–40)
Albumin: 5.1 g/dL — ABNORMAL HIGH (ref 3.9–5.0)
Alkaline Phosphatase: 85 IU/L (ref 39–117)
Bilirubin Total: 0.4 mg/dL (ref 0.0–1.2)
Bilirubin, Direct: 0.12 mg/dL (ref 0.00–0.40)
Total Protein: 7.4 g/dL (ref 6.0–8.5)

## 2019-10-20 ENCOUNTER — Encounter: Payer: Self-pay | Admitting: Gastroenterology

## 2019-10-21 ENCOUNTER — Telehealth: Payer: Self-pay

## 2019-10-21 NOTE — Telephone Encounter (Signed)
Spoke with pt and informed her of lab results and Dr. Johnney Killian suggestion. Pt states she doesn't have any further questions and agrees to follow up with her pcp.

## 2019-10-21 NOTE — Telephone Encounter (Signed)
-----   Message from Wyline Mood, MD sent at 10/20/2019  3:30 PM EDT ----- Denton Meek inform LFT's are normal- Probably best to have a video visit to address any questions she has before we decide if she can follow up with Darrick Huntsman Mar Daring, MD   Dr Wyline Mood MD,MRCP [facility]) Gastroenterology/Hepatology Pager: [phone]

## 2019-11-04 ENCOUNTER — Telehealth: Payer: Self-pay | Admitting: Internal Medicine

## 2019-11-04 NOTE — Telephone Encounter (Signed)
Pt wants depo shot records faxed to the school [employer] [phone]

## 2019-11-04 NOTE — Telephone Encounter (Signed)
Last two DEPO and last two Notes have been sent

## 2020-01-06 DIAGNOSIS — R0981 Nasal congestion: Secondary | ICD-10-CM | POA: Diagnosis not present

## 2020-01-06 DIAGNOSIS — R05 Cough: Secondary | ICD-10-CM | POA: Diagnosis not present

## 2020-01-06 DIAGNOSIS — J309 Allergic rhinitis, unspecified: Secondary | ICD-10-CM | POA: Diagnosis not present

## 2020-01-06 DIAGNOSIS — R0982 Postnasal drip: Secondary | ICD-10-CM | POA: Diagnosis not present

## 2020-02-04 ENCOUNTER — Ambulatory Visit: Payer: BC Managed Care – PPO

## 2020-02-20 DIAGNOSIS — E559 Vitamin D deficiency, unspecified: Secondary | ICD-10-CM | POA: Diagnosis not present

## 2020-02-20 DIAGNOSIS — R87610 Atypical squamous cells of undetermined significance on cytologic smear of cervix (ASC-US): Secondary | ICD-10-CM | POA: Diagnosis not present

## 2020-02-20 DIAGNOSIS — Z01419 Encounter for gynecological examination (general) (routine) without abnormal findings: Secondary | ICD-10-CM | POA: Diagnosis not present

## 2020-02-20 DIAGNOSIS — D649 Anemia, unspecified: Secondary | ICD-10-CM | POA: Diagnosis not present

## 2020-04-23 DIAGNOSIS — Z23 Encounter for immunization: Secondary | ICD-10-CM | POA: Diagnosis not present

## 2020-04-23 DIAGNOSIS — Z3042 Encounter for surveillance of injectable contraceptive: Secondary | ICD-10-CM | POA: Diagnosis not present

## 2020-07-13 NOTE — Telephone Encounter (Signed)
Historical medcation

## 2020-07-14 ENCOUNTER — Emergency Department (INDEPENDENT_AMBULATORY_CARE_PROVIDER_SITE_OTHER)
Admission: EM | Admit: 2020-07-14 | Discharge: 2020-07-14 | Disposition: A | Discharge status: Home / Self Care | Payer: 59 | Source: Home / Self Care | Attending: Family Medicine | Admitting: Family Medicine

## 2020-07-14 ENCOUNTER — Other Ambulatory Visit: Payer: Self-pay

## 2020-07-14 DIAGNOSIS — J069 Acute upper respiratory infection, unspecified: Secondary | ICD-10-CM

## 2020-07-14 MED ORDER — ALBUTEROL SULFATE HFA 108 (90 BASE) MCG/ACT IN AERS: 2.0000 | INHALATION_SPRAY | Freq: Four times a day (QID) | RESPIRATORY_TRACT | 0 refills | Status: DC | PRN

## 2020-07-14 NOTE — Discharge Instructions (Signed)
Go home to rest Drink plenty of fluids Take Tylenol for pain or fever You may take over-the-counter cough and cold medicines as needed You must quarantine at home until your test result is available You can check for your test result in MyChart  

## 2020-07-14 NOTE — ED Triage Notes (Signed)
Patient presents to Urgent Care with complaints of generalized body aches, fatigue, cough, and headache since yesterday. Patient reports she has been really stressed out with final exams, unsure if related. Roommate had mono 2 weeks ago but the pt did not get tested for it. Pt has hx of asthma, would like a refill for her inhaler (has a little bit left). Pt has had both covid and flu vaccines.

## 2020-07-14 NOTE — ED Provider Notes (Signed)
[facility]    CSN: [id number] Arrival date & time: 07/14/20  1437      History   Chief Complaint Chief Complaint  Patient presents with  . Generalized Body Aches  . Cough    HPI Kimberly Moyer is a 24 y.o. female.   HPI  Patient is here with upper respiratory symptoms.  Fatigue.  Cough.  Runny nose.  Would like Covid testing and flu testing.  Has not been around anyone who is sick.  Her roommate had mono a few weeks back, but patient states she had mono when she was younger.  No swollen glands.  Mild sore throat.  Poor appetite.   is drinking well  Past Medical History:  Diagnosis Date  . Asthma     Patient Active Problem List   Diagnosis Date Noted  . Erythrocytosis 09/16/2019  . Cold intolerance 09/16/2019  . Autoimmune hepatitis (HCC) 06/12/2019  . Elevated ALT measurement 05/28/2019  . History of iron deficiency anemia 05/17/2019  . Contraception, generic surveillance 05/17/2019    Past Surgical History:  Procedure Laterality Date  . NO PAST SURGERIES    . TONSILLECTOMY N/A 03/16/2017   Procedure: TONSILLECTOMY;  Surgeon: Vernie Murders, MD;  Location: [facility];  Service: ENT;  Laterality: N/A;    OB History   No obstetric history on file.      Home Medications    Prior to Admission medications   Medication Sig Start Date End Date Taking? Authorizing Provider  MedroxyPROGESTERone Acetate (DEPO-PROVERA IM) Inject into the muscle.   Yes [provider]  albuterol (PROVENTIL HFA;VENTOLIN HFA) 108 (90 Base) MCG/ACT inhaler Inhale into the lungs every 6 (six) hours as needed for wheezing or shortness of breath.    [provider]  Ascorbic Acid (VITAMIN C) 100 MG tablet Take 100 mg by mouth daily.    [provider]  budesonide-formoterol (SYMBICORT) 160-4.5 MCG/ACT inhaler Symbicort 160 mcg-4.5 mcg/actuation HFA aerosol inhaler    [provider]  cetirizine (ZYRTEC) 10 MG tablet Take 10 mg by mouth  daily.    [provider]  cholecalciferol (VITAMIN D3) 25 MCG (1000 UNIT) tablet Take 1,000 Units by mouth daily.    [provider]    Family History Family History  Problem Relation Age of Onset  . Asthma Mother   . Dementia Maternal Grandmother   . Breast cancer Paternal Grandmother   . Healthy Father     Social History Social History   Tobacco Use  . Smoking status: Never Smoker  . Smokeless tobacco: Never Used  Vaping Use  . Vaping Use: Never used  Substance Use Topics  . Alcohol use: Yes    Alcohol/week: 1.0 standard drink    Types: 1 Cans of beer per week    Comment: socially  . Drug use: Never     Allergies   Banana, Carrot [daucus carota], Mushroom extract complex, and Watermelon [citrullus vulgaris]   Review of Systems Review of Systems See HPI  Physical Exam Triage Vital Signs ED Triage Vitals  Enc Vitals Group     BP 07/14/20 1453 132/78     Pulse Rate 07/14/20 1453 96     Resp 07/14/20 1453 16     Temp 07/14/20 1453 98.9 F (37.2 C)     Temp Source 07/14/20 1453 Oral     SpO2 07/14/20 1453 98 %     Weight --      Height --  Head Circumference --      Peak Flow --      Pain Score 07/14/20 1450 6     Pain Loc --      Pain Edu? --      Excl. in GC? --    No data found.  Updated Vital Signs BP 132/78 (BP Location: Right Arm)   Pulse 96   Temp 98.9 F (37.2 C) (Oral)   Resp 16   SpO2 98%       Physical Exam Constitutional:      General: She is not in acute distress.    Appearance: She is well-developed.  HENT:     Head: Normocephalic and atraumatic.     Right Ear: Tympanic membrane, ear canal and external ear normal.     Left Ear: Tympanic membrane, ear canal and external ear normal.     Nose: Nose normal.     Mouth/Throat:     Pharynx: No posterior oropharyngeal erythema.  Eyes:     Conjunctiva/sclera: Conjunctivae normal.     Pupils: Pupils are equal, round, and reactive to light.  Cardiovascular:      Rate and Rhythm: Normal rate.  Pulmonary:     Effort: Pulmonary effort is normal. No respiratory distress.  Abdominal:     General: There is no distension.     Palpations: Abdomen is soft.  Musculoskeletal:        General: Normal range of motion.     Cervical back: Normal range of motion.  Skin:    General: Skin is warm and dry.  Neurological:     Mental Status: She is alert.  Psychiatric:        Behavior: Behavior normal.      UC Treatments / Results  Labs (all labs ordered are listed, but only abnormal results are displayed) Labs Reviewed  COVID-19, FLU A+B AND RSV    EKG   Radiology No results found.  Procedures Procedures (including critical care time)  Medications Ordered in UC Medications - No data to display  Initial Impression / Assessment and Plan / UC Course  I have reviewed the triage vital signs and the nursing notes.  Pertinent labs & imaging results that were available during my care of the patient were reviewed by me and considered in my medical decision making (see chart for details).     Reviewed symptomatic treatment of viral illness Final Clinical Impressions(s) / UC Diagnoses   Final diagnoses:  Viral URI with cough     Discharge Instructions     Go home to rest Drink plenty of fluids Take Tylenol for pain or fever You may take over-the-counter cough and cold medicines as needed You must quarantine at home until your test result is available You can check for your test result in MyChart    ED Prescriptions    None     PDMP not reviewed this encounter.   Eustace Moore, MD 07/14/20 1700

## 2020-07-15 ENCOUNTER — Telehealth: Payer: Self-pay | Admitting: Emergency Medicine

## 2020-07-17 ENCOUNTER — Telehealth: Payer: Self-pay

## 2020-07-17 NOTE — Telephone Encounter (Signed)
Patient called for test results, no results available at this time. MyChart discussed.

## 2020-07-18 LAB — COVID-19, FLU A+B AND RSV
Influenza A, NAA: DETECTED — AB
Influenza B, NAA: NOT DETECTED
RSV, NAA: NOT DETECTED
SARS-CoV-2, NAA: NOT DETECTED

## 2020-11-30 ENCOUNTER — Other Ambulatory Visit: Payer: Self-pay

## 2020-12-02 ENCOUNTER — Ambulatory Visit (INDEPENDENT_AMBULATORY_CARE_PROVIDER_SITE_OTHER): Payer: 59 | Admitting: Internal Medicine

## 2020-12-02 ENCOUNTER — Encounter: Payer: Self-pay | Admitting: Internal Medicine

## 2020-12-02 ENCOUNTER — Other Ambulatory Visit: Payer: Self-pay

## 2020-12-02 VITALS — BP 124/86 | HR 83 | Temp 97.9°F | Ht 65.0 in | Wt 166.5 lb

## 2020-12-02 DIAGNOSIS — L989 Disorder of the skin and subcutaneous tissue, unspecified: Secondary | ICD-10-CM | POA: Diagnosis not present

## 2020-12-02 DIAGNOSIS — H6993 Unspecified Eustachian tube disorder, bilateral: Secondary | ICD-10-CM | POA: Diagnosis not present

## 2020-12-02 DIAGNOSIS — L700 Acne vulgaris: Secondary | ICD-10-CM

## 2020-12-02 DIAGNOSIS — Z1283 Encounter for screening for malignant neoplasm of skin: Secondary | ICD-10-CM

## 2020-12-02 DIAGNOSIS — J309 Allergic rhinitis, unspecified: Secondary | ICD-10-CM | POA: Insufficient documentation

## 2020-12-02 NOTE — Progress Notes (Signed)
Chief Complaint  Patient presents with  . Nevus   F/u  1. Lower back with raised skin lesion had all her life but getting darker and bleeding at times recently agreeable to see dermatology   Review of Systems  Constitutional: Negative for weight loss.  HENT:       +ears popping   Respiratory: Negative for shortness of breath.   Cardiovascular: Negative for chest pain.  Skin:       +low back skin lesion   Past Medical History:  Diagnosis Date  . Asthma    Past Surgical History:  Procedure Laterality Date  . NO PAST SURGERIES    . TONSILLECTOMY N/A 03/16/2017   Procedure: TONSILLECTOMY;  Surgeon: Vernie Murders, MD;  Location: [facility];  Service: ENT;  Laterality: N/A;   Family History  Problem Relation Age of Onset  . Asthma Mother   . Dementia Maternal Grandmother   . Breast cancer Paternal Grandmother   . Healthy Father    Social History   Socioeconomic History  . Marital status: Single    Spouse name: Not on file  . Number of children: Not on file  . Years of education: Not on file  . Highest education level: Not on file  Occupational History  . Not on file  Tobacco Use  . Smoking status: Never Smoker  . Smokeless tobacco: Never Used  Vaping Use  . Vaping Use: Never used  Substance and Sexual Activity  . Alcohol use: Yes    Alcohol/week: 1.0 standard drink    Types: 1 Cans of beer per week    Comment: socially  . Drug use: Never  . Sexual activity: Not Currently  Other Topics Concern  . Not on file  Social History Narrative  . Not on file   Social Determinants of Health   Financial Resource Strain: Not on file  Food Insecurity: Not on file  Transportation Needs: Not on file  Physical Activity: Not on file  Stress: Not on file  Social Connections: Not on file  Intimate Partner Violence: Not on file   Current Meds  Medication Sig  . albuterol (VENTOLIN HFA) 108 (90 Base) MCG/ACT inhaler Inhale 2 puffs into the lungs every 6 (six) hours  as needed for wheezing or shortness of breath.  . Ascorbic Acid (VITAMIN C) 100 MG tablet Take 100 mg by mouth daily.  . Calcium Carbonate-Vitamin D 600-400 MG-UNIT tablet calcium carbonate 600 mg-vitamin D3 10 mcg (400 unit) tablet  Take 1 tablet every day by oral route.  take calcium supplement as long as on Depo Provera  . cetirizine (ZYRTEC) 10 MG tablet Take 10 mg by mouth daily.  . cholecalciferol (VITAMIN D3) 25 MCG (1000 UNIT) tablet Take 1,000 Units by mouth daily.  . MedroxyPROGESTERone Acetate (DEPO-PROVERA IM) Inject into the muscle.   Allergies  Allergen Reactions  . Banana Itching    Throat and ears  . Carrot [Daucus Carota] Itching    Throat and ears  . Mushroom Extract Complex Itching    Throat and ears  . Watermelon [Citrullus Vulgaris] Itching    Throat and ears   No results found for this or any previous visit (from the past 2160 hour(s)). Objective  Body mass index is 27.71 kg/m. Wt Readings from Last 3 Encounters:  12/02/20 166 lb 8 oz (75.5 kg)  09/16/19 166 lb (75.3 kg)  06/13/19 166 lb 9.6 oz (75.6 kg)   Temp Readings from Last 3 Encounters:  12/02/20 97.9  F (36.6 C) (Oral)  07/14/20 98.9 F (37.2 C) (Oral)  06/13/19 98.1 F (36.7 C)   BP Readings from Last 3 Encounters:  12/02/20 124/86  07/14/20 132/78  06/13/19 131/62   Pulse Readings from Last 3 Encounters:  12/02/20 83  07/14/20 96  06/13/19 83    Physical Exam Vitals and nursing note reviewed.  Constitutional:      Appearance: Normal appearance. She is well-developed and well-groomed.  HENT:     Head: Normocephalic and atraumatic.  Cardiovascular:     Rate and Rhythm: Normal rate and regular rhythm.     Heart sounds: Normal heart sounds. No murmur heard.   Pulmonary:     Effort: Pulmonary effort is normal.     Breath sounds: Normal breath sounds.  Skin:    General: Skin is warm and dry.       Neurological:     General: No focal deficit present.     Mental Status: She  is alert and oriented to person, place, and time. Mental status is at baseline.     Gait: Gait normal.  Psychiatric:        Attention and Perception: Attention and perception normal.        Mood and Affect: Mood and affect normal.        Speech: Speech normal.        Behavior: Behavior normal. Behavior is cooperative.        Thought Content: Thought content normal.        Cognition and Memory: Cognition and memory normal.        Judgment: Judgment normal.     Assessment  Plan  Skin lesion low back Benign skin tag vs pedunculated nevus but with bleeding and darker in color  - Plan: Ambulatory referral to Dermatology Skin cancer screening - Plan: Ambulatory referral to Dermatology Acne vulgaris - Plan: Ambulatory referral to Dermatology   ETD/allergies Zyrtec Nasal saline  nasacort or flonase  All over the counter   Provider: Dr. French Ana McLean-Scocuzza-[facility] Medicine

## 2020-12-02 NOTE — Patient Instructions (Signed)
Zyrtec Nasal saline  nasacort or flonase  All over the counter  Carbamide Peroxide ear solution What is this medicine? CARBAMIDE PEROXIDE (CAR bah mide per OX ide) is used to soften and help remove ear wax. This medicine may be used for other purposes; ask your health care provider or pharmacist if you have questions. COMMON BRAND NAME(S): Auro Ear, Auro Earache Relief, Debrox, Ear Drops, Ear Wax Removal, Ear Wax Remover, Earwax Treatment, Murine, Thera-Ear What should I tell my health care provider before I take this medicine? They need to know if you have any of these conditions:  dizziness  ear discharge  ear pain, irritation or rash  infection  perforated eardrum (hole in eardrum)  an unusual or allergic reaction to carbamide peroxide, glycerin, hydrogen peroxide, other medicines, foods, dyes, or preservatives  pregnant or trying to get pregnant  breast-feeding How should I use this medicine? This medicine is only for use in the outer ear canal. Follow the directions carefully. Wash hands before and after use. The solution may be warmed by holding the bottle in the hand for 1 to 2 minutes. Lie with the affected ear facing upward. Place the proper number of drops into the ear canal. After the drops are instilled, remain lying with the affected ear upward for 5 minutes to help the drops stay in the ear canal. A cotton ball may be gently inserted at the ear opening for no longer than 5 to 10 minutes to ensure retention. Repeat, if necessary, for the opposite ear. Do not touch the tip of the dropper to the ear, fingertips, or other surface. Do not rinse the dropper after use. Keep container tightly closed. Talk to your pediatrician regarding the use of this medicine in children. While this drug may be used in children as young as 12 years for selected conditions, precautions do apply. Overdosage: If you think you have taken too much of this medicine contact a poison control center or  emergency room at once. NOTE: This medicine is only for you. Do not share this medicine with others. What if I miss a dose? If you miss a dose, use it as soon as you can. If it is almost time for your next dose, use only that dose. Do not use double or extra doses. What may interact with this medicine? Interactions are not expected. Do not use any other ear products without asking your doctor or health care professional. This list may not describe all possible interactions. Give your health care provider a list of all the medicines, herbs, non-prescription drugs, or dietary supplements you use. Also tell them if you smoke, drink alcohol, or use illegal drugs. Some items may interact with your medicine. What should I watch for while using this medicine? This medicine is not for long-term use. Do not use for more than 4 days without checking with your health care professional. Contact your doctor or health care professional if your condition does not start to get better within a few days or if you notice burning, redness, itching or swelling. What side effects may I notice from receiving this medicine? Side effects that you should report to your doctor or health care professional as soon as possible:  allergic reactions like skin rash, itching or hives, swelling of the face, lips, or tongue  burning, itching, and redness  worsening ear pain  rash Side effects that usually do not require medical attention (report to your doctor or health care professional if they continue or  are bothersome):  abnormal sensation while putting the drops in the ear  temporary reduction in hearing (but not complete loss of hearing) This list may not describe all possible side effects. Call your doctor for medical advice about side effects. You may report side effects to FDA at 1-800-FDA-1088. Where should I keep my medicine? Keep out of the reach of children. Store at room temperature between 15 and 30 degrees C  (59 and 86 degrees F) in a tight, light-resistant container. Keep bottle away from excessive heat and direct sunlight. Throw away any unused medicine after the expiration date. NOTE: This sheet is a summary. It may not cover all possible information. If you have questions about this medicine, talk to your doctor, pharmacist, or health care provider.  2021 Elsevier/Gold Standard (2007-11-06 14:00:02)  Earwax Buildup, Adult The ears produce a substance called earwax that helps keep bacteria out of the ear and protects the skin in the ear canal. Occasionally, earwax can build up in the ear and cause discomfort or hearing loss. What are the causes? This condition is caused by a buildup of earwax. Ear canals are self-cleaning. Ear wax is made in the outer part of the ear canal and generally falls out in small amounts over time. When the self-cleaning mechanism is not working, earwax builds up and can cause decreased hearing and discomfort. Attempting to clean ears with cotton swabs can push the earwax deep into the ear canal and cause decreased hearing and pain. What increases the risk? This condition is more likely to develop in people who:  Clean their ears often with cotton swabs.  Pick at their ears.  Use earplugs or in-ear headphones often, or wear hearing aids. The following factors may also make you more likely to develop this condition:  Being female.  Being of older age.  Naturally producing more earwax.  Having narrow ear canals.  Having earwax that is overly thick or sticky.  Having excess hair in the ear canal.  Having eczema.  Being dehydrated. What are the signs or symptoms? Symptoms of this condition include:  Reduced or muffled hearing.  A feeling of fullness in the ear or feeling that the ear is plugged.  Fluid coming from the ear.  Ear pain or an itchy ear.  Ringing in the ear.  Coughing.  Balance problems.  An obvious piece of earwax that can be seen  inside the ear canal. How is this diagnosed? This condition may be diagnosed based on:  Your symptoms.  Your medical history.  An ear exam. During the exam, your health care provider will look into your ear with an instrument called an otoscope. You may have tests, including a hearing test. How is this treated? This condition may be treated by:  Using ear drops to soften the earwax.  Having the earwax removed by a health care provider. The health care provider may: ? Flush the ear with water. ? Use an instrument that has a loop on the end (curette). ? Use a suction device.  Having surgery to remove the wax buildup. This may be done in severe cases. Follow these instructions at home:  Take over-the-counter and prescription medicines only as told by your health care provider.  Do not put any objects, including cotton swabs, into your ear. You can clean the opening of your ear canal with a washcloth or facial tissue.  Follow instructions from your health care provider about cleaning your ears. Do not overclean your ears.  Drink  enough fluid to keep your urine pale yellow. This will help to thin the earwax.  Keep all follow-up visits as told. If earwax builds up in your ears often or if you use hearing aids, consider seeing your health care provider for routine, preventive ear cleanings. Ask your health care provider how often you should schedule your cleanings.  If you have hearing aids, clean them according to instructions from the manufacturer and your health care provider.   Contact a health care provider if:  You have ear pain.  You develop a fever.  You have pus or other fluid coming from your ear.  You have hearing loss.  You have ringing in your ears that does not go away.  You feel like the room is spinning (vertigo).  Your symptoms do not improve with treatment. Get help right away if:  You have bleeding from the affected ear.  You have severe ear  pain. Summary  Earwax can build up in the ear and cause discomfort or hearing loss.  The most common symptoms of this condition include reduced or muffled hearing, a feeling of fullness in the ear, or feeling that the ear is plugged.  This condition may be diagnosed based on your symptoms, your medical history, and an ear exam.  This condition may be treated by using ear drops to soften the earwax or by having the earwax removed by a health care provider.  Do not put any objects, including cotton swabs, into your ear. You can clean the opening of your ear canal with a washcloth or facial tissue. This information is not intended to replace advice given to you by your health care provider. Make sure you discuss any questions you have with your health care provider. Document Revised: 11/12/2019 Document Reviewed: 11/12/2019 Elsevier Patient Education  2021 Elsevier Inc.  Eustachian Tube Dysfunction  Eustachian tube dysfunction refers to a condition in which a blockage develops in the narrow passage that connects the middle ear to the back of the nose (eustachian tube). The eustachian tube regulates air pressure in the middle ear by letting air move between the ear and nose. It also helps to drain fluid from the middle ear space. Eustachian tube dysfunction can affect one or both ears. When the eustachian tube does not function properly, air pressure, fluid, or both can build up in the middle ear. What are the causes? This condition occurs when the eustachian tube becomes blocked or cannot open normally. Common causes of this condition include:  Ear infections.  Colds and other infections that affect the nose, mouth, and throat (upper respiratory tract).  Allergies.  Irritation from cigarette smoke.  Irritation from stomach acid coming up into the esophagus (gastroesophageal reflux). The esophagus is the tube that carries food from the mouth to the stomach.  Sudden changes in air  pressure, such as from descending in an airplane or scuba diving.  Abnormal growths in the nose or throat, such as: ? Growths that line the nose (nasal polyps). ? Abnormal growth of cells (tumors). ? Enlarged tissue at the back of the throat (adenoids). What increases the risk? You are more likely to develop this condition if:  You smoke.  You are overweight.  You are a child who has: ? Certain birth defects of the mouth, such as cleft palate. ? Large tonsils or adenoids. What are the signs or symptoms? Common symptoms of this condition include:  A feeling of fullness in the ear.  Ear pain.  Clicking  or popping noises in the ear.  Ringing in the ear.  Hearing loss.  Loss of balance.  Dizziness. Symptoms may get worse when the air pressure around you changes, such as when you travel to an area of high elevation, fly on an airplane, or go scuba diving. How is this diagnosed? This condition may be diagnosed based on:  Your symptoms.  A physical exam of your ears, nose, and throat.  Tests, such as those that measure: ? The movement of your eardrum (tympanogram). ? Your hearing (audiometry). How is this treated? Treatment depends on the cause and severity of your condition.  In mild cases, you may relieve your symptoms by moving air into your ears. This is called "popping the ears."  In more severe cases, or if you have symptoms of fluid in your ears, treatment may include: ? Medicines to relieve congestion (decongestants). ? Medicines that treat allergies (antihistamines). ? Nasal sprays or ear drops that contain medicines that reduce swelling (steroids). ? A procedure to drain the fluid in your eardrum (myringotomy). In this procedure, a small tube is placed in the eardrum to:  Drain the fluid.  Restore the air in the middle ear space. ? A procedure to insert a balloon device through the nose to inflate the opening of the eustachian tube (balloon  dilation). Follow these instructions at home: Lifestyle  Do not do any of the following until your health care provider approves: ? Travel to high altitudes. ? Fly in airplanes. ? Work in a Estate agent or room. ? Scuba dive.  Do not use any products that contain nicotine or tobacco, such as cigarettes and e-cigarettes. If you need help quitting, ask your health care provider.  Keep your ears dry. Wear fitted earplugs during showering and bathing. Dry your ears completely after. General instructions  Take over-the-counter and prescription medicines only as told by your health care provider.  Use techniques to help pop your ears as recommended by your health care provider. These may include: ? Chewing gum. ? Yawning. ? Frequent, forceful swallowing. ? Closing your mouth, holding your nose closed, and gently blowing as if you are trying to blow air out of your nose.  Keep all follow-up visits as told by your health care provider. This is important. Contact a health care provider if:  Your symptoms do not go away after treatment.  Your symptoms come back after treatment.  You are unable to pop your ears.  You have: ? A fever. ? Pain in your ear. ? Pain in your head or neck. ? Fluid draining from your ear.  Your hearing suddenly changes.  You become very dizzy.  You lose your balance. Summary  Eustachian tube dysfunction refers to a condition in which a blockage develops in the eustachian tube.  It can be caused by ear infections, allergies, inhaled irritants, or abnormal growths in the nose or throat.  Symptoms include ear pain, hearing loss, or ringing in the ears.  Mild cases are treated with maneuvers to unblock the ears, such as yawning or ear popping.  Severe cases are treated with medicines. Surgery may also be done (rare). This information is not intended to replace advice given to you by your health care provider. Make sure you discuss any questions you  have with your health care provider. Document Revised: 11/14/2017 Document Reviewed: 11/14/2017 Elsevier Patient Education  2021 [employer].

## 2021-04-28 MED ORDER — ALBUTEROL SULFATE HFA 108 (90 BASE) MCG/ACT IN AERS: 2.0000 | INHALATION_SPRAY | Freq: Four times a day (QID) | RESPIRATORY_TRACT | 0 refills | Status: DC | PRN

## 2021-07-07 ENCOUNTER — Encounter: Payer: Self-pay | Admitting: Internal Medicine

## 2021-07-07 ENCOUNTER — Telehealth: Payer: Self-pay

## 2021-07-07 NOTE — Telephone Encounter (Signed)
Patient was informed. She requested it be sent mychart so she remembers.

## 2021-07-07 NOTE — Telephone Encounter (Signed)
Hi Dr. Darrick Huntsman,    I believe I have a sinus infection. I have thick yellow mucus when I blow my nose and i feel pressure in my nose and forehead. Is there anything I can take for this?    Thank you,  Kimberly Moyer

## 2021-07-20 ENCOUNTER — Ambulatory Visit (INDEPENDENT_AMBULATORY_CARE_PROVIDER_SITE_OTHER): Payer: 59 | Admitting: Adult Health

## 2021-07-20 ENCOUNTER — Encounter: Payer: Self-pay | Admitting: Adult Health

## 2021-07-20 ENCOUNTER — Ambulatory Visit (INDEPENDENT_AMBULATORY_CARE_PROVIDER_SITE_OTHER): Payer: 59

## 2021-07-20 ENCOUNTER — Other Ambulatory Visit: Payer: Self-pay

## 2021-07-20 VITALS — BP 128/78 | HR 94 | Temp 96.6°F | Ht 65.0 in | Wt 166.8 lb

## 2021-07-20 DIAGNOSIS — J4520 Mild intermittent asthma, uncomplicated: Secondary | ICD-10-CM

## 2021-07-20 LAB — CBC WITH DIFFERENTIAL/PLATELET
Basophils Absolute: 0.1 10*3/uL (ref 0.0–0.1)
Basophils Relative: 0.7 % (ref 0.0–3.0)
Eosinophils Absolute: 0.4 10*3/uL (ref 0.0–0.7)
Eosinophils Relative: 4 % (ref 0.0–5.0)
HCT: 42.6 % (ref 36.0–46.0)
Hemoglobin: 14.4 g/dL (ref 12.0–15.0)
Lymphocytes Relative: 21.3 % (ref 12.0–46.0)
Lymphs Abs: 1.9 10*3/uL (ref 0.7–4.0)
MCHC: 33.8 g/dL (ref 30.0–36.0)
MCV: 88.8 fl (ref 78.0–100.0)
Monocytes Absolute: 0.6 10*3/uL (ref 0.1–1.0)
Monocytes Relative: 6.6 % (ref 3.0–12.0)
Neutro Abs: 6 10*3/uL (ref 1.4–7.7)
Neutrophils Relative %: 67.4 % (ref 43.0–77.0)
Platelets: 235 10*3/uL (ref 150.0–400.0)
RBC: 4.8 Mil/uL (ref 3.87–5.11)
RDW: 12.7 % (ref 11.5–15.5)
WBC: 8.9 10*3/uL (ref 4.0–10.5)

## 2021-07-20 LAB — TSH: TSH: 1.54 u[IU]/mL (ref 0.35–5.50)

## 2021-07-20 LAB — COMPREHENSIVE METABOLIC PANEL
ALT: 23 U/L (ref 0–35)
AST: 17 U/L (ref 0–37)
Albumin: 4.8 g/dL (ref 3.5–5.2)
Alkaline Phosphatase: 61 U/L (ref 39–117)
BUN: 19 mg/dL (ref 6–23)
CO2: 24 mEq/L (ref 19–32)
Calcium: 10.2 mg/dL (ref 8.4–10.5)
Chloride: 107 mEq/L (ref 96–112)
Creatinine, Ser: 0.78 mg/dL (ref 0.40–1.20)
GFR: 106.57 mL/min (ref >60.00)
Glucose, Bld: 88 mg/dL (ref 70–99)
Potassium: 4.1 mEq/L (ref 3.5–5.1)
Sodium: 139 mEq/L (ref 135–145)
Total Bilirubin: 0.5 mg/dL (ref 0.2–1.2)
Total Protein: 7.2 g/dL (ref 6.0–8.3)

## 2021-07-20 MED ORDER — PREDNISONE 10 MG (21) PO TBPK: ORAL_TABLET | ORAL | 0 refills | Status: DC

## 2021-07-20 MED ORDER — ALBUTEROL SULFATE HFA 108 (90 BASE) MCG/ACT IN AERS: 2.0000 | INHALATION_SPRAY | Freq: Four times a day (QID) | RESPIRATORY_TRACT | 0 refills | Status: DC | PRN

## 2021-07-20 MED ORDER — FLUTICASONE-SALMETEROL 100-50 MCG/ACT IN AEPB: 1.0000 | INHALATION_SPRAY | Freq: Two times a day (BID) | RESPIRATORY_TRACT | 1 refills | Status: DC

## 2021-07-20 MED ORDER — LEVOCETIRIZINE DIHYDROCHLORIDE 5 MG PO TABS: 5.0000 mg | ORAL_TABLET | Freq: Every evening | ORAL | 1 refills | Status: DC

## 2021-07-20 NOTE — Patient Instructions (Addendum)
Allergies, Adult An allergy is a condition in which the body's defense system (immune system) comes in contact with an allergen and reacts to it. An allergen is anything that causes an allergic reaction. Allergens cause the immune system to make proteins for fighting infections (antibodies). These antibodies cause cells to release chemicals called histamines that set off the symptoms of an allergic reaction. Allergies often affect the nasal passages (allergic rhinitis), eyes (allergic conjunctivitis), skin (atopic dermatitis), and stomach. Allergies can be mild, moderate, or severe. They cannot spread from person to person. Allergies can develop at any age and may be outgrown. What are the causes? This condition is caused by allergens. Common allergens include: Outdoor allergens, such as pollen, car fumes, and mold. Indoor allergens, such as dust, smoke, mold, and pet dander. Other allergens, such as foods, medicines, scents, insect bites or stings, and other skin irritants. What increases the risk? You are more likely to develop this condition if you have: Family members with allergies. Family members who have any condition that may be caused by allergens, such as asthma. This may make you more likely to have other allergies. What are the signs or symptoms? Symptoms of this condition depend on the severity of the allergy. Mild to moderate symptoms Runny nose, stuffy nose (nasal congestion), or sneezing. Itchy mouth, ears, or throat. A feeling of mucus dripping down the back of your throat (postnasal drip). Sore throat. Itchy, red, watery, or puffy eyes. Skin rash, or itchy, red, swollen areas of skin (hives). Stomach cramps or bloating. Severe symptoms Severe allergies to food, medicine, or insect bites may cause anaphylaxis, which can be life-threatening. Symptoms include: A red (flushed) face. Wheezing or coughing. Swollen lips, tongue, or mouth. Tight or swollen throat. Chest pain or  tightness, or rapid heartbeat. Trouble breathing or shortness of breath. Pain in the abdomen, vomiting, or diarrhea. Dizziness or fainting. How is this diagnosed? This condition is diagnosed based on your symptoms, your family and medical history, and a physical exam. You may also have tests, including: Skin tests to see how your skin reacts to allergens that may be causing your symptoms. Tests include: Skin prick test. For this test, an allergen is introduced to your body through a small opening in the skin. Intradermal skin test. For this test, a small amount of allergen is injected under the first layer of your skin. Patch test. For this test, a small amount of allergen is placed on your skin. The area is covered and then checked after a few days. Blood tests. A challenge test. For this test, you will eat or breathe in a small amount of allergen to see if you have an allergic reaction. You may also be asked to: Keep a food diary. This is a record of all the foods, drinks, and symptoms you have in a day. Try an elimination diet. To do this: Remove certain foods from your diet. Add those foods back one by one to find out if any foods cause an allergic reaction. How is this treated?   Treatment for allergies depends on your symptoms. Treatment may include: Cold, wet cloths (cold compresses) to soothe itching and swelling. Eye drops or nasal sprays. Nasal irrigation to help clear your mucus or keep the nasal passages moist. A humidifier to add moisture to the air. Skin creams to treat rashes or itching. Oral antihistamines or other medicines to block the reaction or to treat inflammation. Diet changes to remove foods that cause allergies. Being exposed again  and again to tiny amounts of allergens to help you build a defense against it (tolerance). This is called immunotherapy. Examples include: Allergy shot. You receive an injection that contains an allergen. Sublingual immunotherapy. You  take a small dose of allergen under your tongue. Emergency injection for anaphylaxis. You give yourself a shot using a syringe (auto-injector) that contains the amount of medicine you need. Your health care provider will teach you how to give yourself an injection. Follow these instructions at home: Medicines  Take or apply over-the-counter and prescription medicines only as told by your health care provider. Always carry your auto-injector pen if you are at risk of anaphylaxis. Give yourself an injection as told by your health care provider. Eating and drinking Follow instructions from your health care provider about eating or drinking restrictions. Drink enough fluid to keep your urine pale yellow. General instructions Wear a medical alert bracelet or necklace to let others know that you have had anaphylaxis before. Avoid known allergens whenever possible. Keep all follow-up visits as told by your health care provider. This is important. Contact a health care provider if: Your symptoms do not get better with treatment. Get help right away if: You have symptoms of anaphylaxis. These include: Swollen mouth, tongue, or throat. Pain or tightness in your chest. Trouble breathing or shortness of breath. Dizziness or fainting. Severe abdominal pain, vomiting, or diarrhea. These symptoms may represent a serious problem that is an emergency. Do not wait to see if the symptoms will go away. Get medical help right away. Call your local emergency services (911 in the [place] Do not drive yourself to the hospital. Summary Take or apply over-the-counter and prescription medicines only as told by your health care provider. Avoid known allergens when possible. Always carry your auto-injector pen if you are at risk of anaphylaxis. Give yourself an injection as told by your health care provider. Wear a medical alert bracelet or necklace to let others know that you have had anaphylaxis before. Anaphylaxis  is a life-threatening emergency. Get help right away. This information is not intended to replace advice given to you by your health care provider. Make sure you discuss any questions you have with your health care provider. Document Revised: 03/23/2020 Document Reviewed: 06/05/2019 [employer] Patient Education  2022 [employer] Cough, Adult A cough helps to clear your throat and lungs. A cough may be a sign of an illness or another medical condition. An acute cough may only last 2-3 weeks, while a chronic cough may last 8 or more weeks. Many things can cause a cough. They include: Germs (viruses or bacteria) that attack the airway. Breathing in things that bother (irritate) your lungs. Allergies. Asthma. Mucus that runs down the back of your throat (postnasal drip). Smoking. Acid backing up from the stomach into the tube that moves food from the mouth to the stomach (gastroesophageal reflux). Some medicines. Lung problems. Other medical conditions, such as heart failure or a blood clot in the lung (pulmonary embolism). Follow these instructions at home: Medicines Take over-the-counter and prescription medicines only as told by your doctor. Talk with your doctor before you take medicines that stop a cough (cough suppressants). Lifestyle  Do not smoke, and try not to be around smoke. Do not use any products that contain nicotine or tobacco, such as cigarettes, e-cigarettes, and chewing tobacco. If you need help quitting, ask your doctor. Drink enough fluid to keep your pee (urine) pale yellow. Avoid caffeine. Do not drink alcohol if your  doctor tells you not to drink. General instructions  Watch for any changes in your cough. Tell your doctor about them. Always cover your mouth when you cough. Stay away from things that make you cough, such as perfume, candles, campfire smoke, or cleaning products. If the air is dry, use a cool mist vaporizer or humidifier in your home. If your  cough is worse at night, try using extra pillows to raise your head up higher while you sleep. Rest as needed. Keep all follow-up visits as told by your doctor. This is important. Contact a doctor if: You have new symptoms. You cough up pus. Your cough does not get better after 2-3 weeks, or your cough gets worse. Cough medicine does not help your cough and you are not sleeping well. You have pain that gets worse or pain that is not helped with medicine. You have a fever. You are losing weight and you do not know why. You have night sweats. Get help right away if: You cough up blood. You have trouble breathing. Your heartbeat is very fast. These symptoms may be an emergency. Do not wait to see if the symptoms will go away. Get medical help right away. Call your local emergency services (911 in the [place] Do not drive yourself to the hospital. Summary A cough helps to clear your throat and lungs. Many things can cause a cough. Take over-the-counter and prescription medicines only as told by your doctor. Always cover your mouth when you cough. Contact a doctor if you have new symptoms or you have a cough that does not get better or gets worse. This information is not intended to replace advice given to you by your health care provider. Make sure you discuss any questions you have with your health care provider. Document Revised: 09/13/2019 Document Reviewed: 08/13/2018 [employer] Patient Education  2022 [employer] Asthma Attack Asthma attack, also called acute bronchospasm, is the sudden narrowing and tightening of the air passages, which limits the amount of oxygen that can get into the lungs. The narrowing is caused by inflammation and tightening of the muscles in the air tubes (bronchi) in the lungs. Too much mucus is also produced, which narrows the airways more. This can cause trouble breathing, loud breathing (wheezing), and coughing. The goal of treatment is to open the airways in  the lungs and reduce inflammation. What are the causes? Possible causes or triggers of this condition include: Animal dander, dust mites, or cockroaches. Mold, pollen from trees or grass, or cold air. Air pollutants such as dust, household cleaners, aerosol sprays, strong chemicals, strong odors, and smoke of any kind. Stress or strong emotions such as crying or laughing hard. Exercise or activity that requires a lot of energy. Substances in foods and drinks, such as dried fruits and wine, called sulfites. Certain medicines or medical conditions such as: Aspirin or beta-blockers. Infections or inflammatory conditions, such as a flu (influenza), a cold, pneumonia, or inflammation of the nasal membranes (rhinitis). Gastroesophageal reflux disease (GERD). GERD is a condition in which stomach acid backs up into your esophagus and spills into your trachea (windpipe), which can irritate your airways. What are the signs or symptoms? Symptoms of this condition include: Wheezing. This may sound like whistling while breathing. This may only happen at night. Excessive coughing. This may only happen at night. Chest tightness or pain. Shortness of breath. Feeling like you cannot get enough air no matter how hard you breathe (air hunger). How is this diagnosed? This  condition may be diagnosed based on: Your medical history. Your symptoms. A physical exam. Tests to check for other causes of your symptoms or other conditions that may have triggered your asthma attack. These tests may include: A chest X-ray. Blood tests. Tests to assess lung function, such as breathing into a device that measures how much air you can inhale and exhale (spirometry). How is this treated? Treatment for this condition depends on the severity and cause of your asthma attack. For mild attacks, you may receive medicines through a hand-held inhaler (metered dose inhaler, or MDI) or through a device that turns liquid medicine  into a mist (nebulizer). These medicines include: Quick relief or rescue medicines that quickly relax the airways and lungs. Long-acting medicines that are used daily to prevent (control) your asthma symptoms. For moderate or severe attacks, you may be treated with steroid medicines by mouth or through an IV injection at the hospital. For severe attacks, you may need oxygen therapy or a breathing machine (ventilator). If your asthma attack was caused by an infection from bacteria, you will be given antibiotic medicines. Follow these instructions at home: Medicines Take over-the-counter and prescription medicines only as told by your health care provider. Keep your medicines up-to-date. Make sure you have all of your medicines available at all times. If you were prescribed an antibiotic medicine, take it as told by your health care provider. Do not stop taking the antibiotic even if you start to feel better. Tell your doctor if you may be pregnant to make sure your asthma medicine is safe to use during pregnancy. Avoiding triggers  Keep track of things that trigger your asthma attacks. Avoid exposure to these triggers. Do not use any products that contain nicotine or tobacco, such as cigarettes, e-cigarettes, and chewing tobacco. If you need help quitting, ask your health care provider. When there is a lot of pollen, air pollution, or humidity, keep windows closed and use an air conditioner or go to places with air conditioning. Asthma action plan Work with your health care provider to make a written plan for managing and treating your asthma attacks (asthma action plan). This plan should include: A list of your asthma triggers and how to avoid them. A list of symptoms that you may have during an asthma attack. Information about which medicine to take, when to take the medicine and how much of the medicine to take. Information to help you understand your peak flow measurements. Daily actions  that you can take to control your asthma symptoms. Contact information for your health care providers. If you have an asthma attack, act quickly. Follow the emergency steps on your written asthma action plan. This may prevent you from needing to go to the hospital. General instructions Avoid excessive exercise or activity until your asthma attack goes away. Ask your health care provider what activities are safe for you and when you can return to your normal activities. Stay up to date on all your vaccines, such as flu and pneumonia vaccines. Drink enough fluid to keep your urine pale yellow. Staying hydrated helps keep mucus in your lungs thin so it can be coughed up easily. Do not use alcohol until you have recovered. Keep all follow-up visits as told by your health care provider. This is important. Asthma requires careful medical care. Contact a health care provider if: You have followed your action plan for 1 hour and your peak flow reading is still at 50-79%. This is in the yellow zone,  which means "caution." You need to use your quick reliever medicine more frequently than normal. Your medicines are causing side effects, such as rash, itching, swelling, or trouble breathing. Your symptoms do not improve after 48 hours. You cough up mucus that is thicker than usual. You have a fever. Get help right away if: Your peak flow reading is less than 50% of your personal best. This is in the red zone, which means "danger." You have trouble breathing. You develop chest pain or discomfort. Your medicines no longer seem to be helping. You are coughing up bloody mucus. You have a fever and your symptoms suddenly get worse. You have trouble swallowing. You feel very tired, and breathing becomes tiring. These symptoms may represent a serious problem that is an emergency. Do not wait to see if the symptoms will go away. Get medical help right away. Call your local emergency services (911 in the [place]  Do not drive yourself to the hospital. Summary Asthma attacks are caused by narrowing or tightness in air passages, which causes shortness of breath, coughing, and loud breathing (wheezing). Many things can trigger an asthma attack, such as allergens, weather changes, exercise, strong odors, and smoke of any kind. If you have an asthma attack, act quickly. Follow the emergency steps on your written asthma action plan. Get help right away if you have severe trouble breathing, chest pain, or fever, or if your home medicines are no longer helping with your symptoms. This information is not intended to replace advice given to you by your health care provider. Make sure you discuss any questions you have with your health care provider. Document Revised: 07/23/2019 Document Reviewed: 07/23/2019 [employer] Patient Education  2022 [employer].

## 2021-07-20 NOTE — Progress Notes (Signed)
Acute Office Visit  Subjective:    Patient ID: Kimberly Moyer, female    DOB: January 20, 1996, 25 y.o.   MRN: [id number]  Chief Complaint  Patient presents with   Asthma    Asthma She complains of cough, shortness of breath (none today had 12/3) and wheezing. Pertinent negatives include no chest pain. Her past medical history is significant for asthma.  Patient is in today for suspected asthma attack, no previous since 2019.  She was around a bunch of smoking men, she had chest tightness and used her inhaler she feels she used albuterol about 16 times since December 13th. Mild cough since that time.  She has been on zyrtec for years.  She has been around friends that vape. She does not vape or smoke.  Coughing more around vapers and smokers. Has a cat.  She used albuterol inhaler without much relief. She has used albuterol inhaler at home, she has not been using Symbicort.    Sinus infection type symptoms reported on my chart on 07/07/21 and now all resolved.   Denies any illness.  Patient  denies any fever, body aches,chills, rash, chest pain, nausea, vomiting, or diarrhea.  Denies dizziness, lightheadedness, pre syncopal or syncopal episodes.   No LMP recorded. Patient has had an injection.  Past Medical History:  Diagnosis Date   Asthma     Past Surgical History:  Procedure Laterality Date   NO PAST SURGERIES     TONSILLECTOMY N/A 03/16/2017   Procedure: TONSILLECTOMY;  Surgeon: Vernie Murders, MD;  Location: [facility];  Service: ENT;  Laterality: N/A;    Family History  Problem Relation Age of Onset   Asthma Mother    Dementia Maternal Grandmother    Breast cancer Paternal Grandmother    Healthy Father     Social History   Socioeconomic History   Marital status: Single    Spouse name: Not on file   Number of children: Not on file   Years of education: Not on file   Highest education level: Not on file  Occupational History   Not on file  Tobacco  Use   Smoking status: Never   Smokeless tobacco: Never  Vaping Use   Vaping Use: Never used  Substance and Sexual Activity   Alcohol use: Yes    Alcohol/week: 1.0 standard drink    Types: 1 Cans of beer per week    Comment: socially   Drug use: Never   Sexual activity: Not Currently  Other Topics Concern   Not on file  Social History Narrative   Not on file   Social Determinants of Health   Financial Resource Strain: Not on file  Food Insecurity: Not on file  Transportation Needs: Not on file  Physical Activity: Not on file  Stress: Not on file  Social Connections: Not on file  Intimate Partner Violence: Not on file    Outpatient Medications Prior to Visit  Medication Sig Dispense Refill   Ascorbic Acid (VITAMIN C) 100 MG tablet Take 100 mg by mouth daily.     Calcium Carbonate-Vitamin D 600-400 MG-UNIT tablet calcium carbonate 600 mg-vitamin D3 10 mcg (400 unit) tablet  Take 1 tablet every day by oral route.  take calcium supplement as long as on Depo Provera     cholecalciferol (VITAMIN D3) 25 MCG (1000 UNIT) tablet Take 1,000 Units by mouth daily.     MedroxyPROGESTERone Acetate (DEPO-PROVERA IM) Inject into the muscle.     albuterol (VENTOLIN  HFA) 108 (90 Base) MCG/ACT inhaler Inhale 2 puffs into the lungs every 6 (six) hours as needed for wheezing or shortness of breath. 18 g 0   cetirizine (ZYRTEC) 10 MG tablet Take 10 mg by mouth daily.     budesonide-formoterol (SYMBICORT) 160-4.5 MCG/ACT inhaler Symbicort 160 mcg-4.5 mcg/actuation HFA aerosol inhaler (Patient not taking: Reported on 12/02/2020)     No facility-administered medications prior to visit.    Allergies  Allergen Reactions   Banana Itching    Throat and ears   Carrot [Daucus Carota] Itching    Throat and ears   Mushroom Extract Complex Itching    Throat and ears   Watermelon [Citrullus Vulgaris] Itching    Throat and ears    Review of Systems  Constitutional: Negative.   HENT: Negative.     Respiratory:  Positive for cough, shortness of breath (none today had 12/3) and wheezing. Negative for apnea, choking, chest tightness and stridor.   Cardiovascular:  Negative for chest pain, palpitations and leg swelling.  Gastrointestinal: Negative.   Genitourinary: Negative.   Musculoskeletal: Negative.   Skin: Negative.   Neurological: Negative.   Psychiatric/Behavioral: Negative.        Objective:    Physical Exam Vitals reviewed.  Constitutional:      General: She is not in acute distress.    Appearance: She is well-developed. She is not diaphoretic.     Interventions: She is not intubated. HENT:     Head: Normocephalic and atraumatic.     Right Ear: External ear normal.     Left Ear: External ear normal.     Nose: Nose normal.     Mouth/Throat:     Pharynx: No oropharyngeal exudate.  Eyes:     General: Lids are normal. No scleral icterus.       Right eye: No discharge.        Left eye: No discharge.     Conjunctiva/sclera: Conjunctivae normal.     Right eye: Right conjunctiva is not injected. No exudate or hemorrhage.    Left eye: Left conjunctiva is not injected. No exudate or hemorrhage.    Pupils: Pupils are equal, round, and reactive to light.  Neck:     Thyroid: No thyroid mass or thyromegaly.     Vascular: Normal carotid pulses. No carotid bruit, hepatojugular reflux or JVD.     Trachea: Trachea and phonation normal. No tracheal tenderness or tracheal deviation.     Meningeal: Brudzinski's sign and Kernig's sign absent.  Cardiovascular:     Rate and Rhythm: Normal rate and regular rhythm.     Pulses: Normal pulses.          Radial pulses are 2+ on the right side and 2+ on the left side.       Dorsalis pedis pulses are 2+ on the right side and 2+ on the left side.       Posterior tibial pulses are 2+ on the right side and 2+ on the left side.     Heart sounds: Normal heart sounds, S1 normal and S2 normal. Heart sounds not distant. No murmur heard.   No  friction rub. No gallop.  Pulmonary:     Effort: Pulmonary effort is normal. No tachypnea, bradypnea, accessory muscle usage or respiratory distress. She is not intubated.     Breath sounds: No stridor. Wheezing (mild expiratory wheeze bilateral upper lobes.) present. No rhonchi or rales.  Chest:     Chest wall: No tenderness.  Abdominal:     General: Bowel sounds are normal. There is no distension or abdominal bruit.     Palpations: Abdomen is soft. There is no shifting dullness, fluid wave, hepatomegaly, splenomegaly, mass or pulsatile mass.     Tenderness: There is no abdominal tenderness. There is no guarding or rebound.     Hernia: No hernia is present.  Musculoskeletal:        General: No tenderness or deformity. Normal range of motion.     Cervical back: Full passive range of motion without pain, normal range of motion and neck supple. No edema, erythema or rigidity. No spinous process tenderness or muscular tenderness. Normal range of motion.  Lymphadenopathy:     Head:     Right side of head: No submental, submandibular, tonsillar, preauricular, posterior auricular or occipital adenopathy.     Left side of head: No submental, submandibular, tonsillar, preauricular, posterior auricular or occipital adenopathy.     Cervical: No cervical adenopathy.     Right cervical: No superficial, deep or posterior cervical adenopathy.    Left cervical: No superficial, deep or posterior cervical adenopathy.     Upper Body:     Right upper body: No supraclavicular or pectoral adenopathy.     Left upper body: No supraclavicular or pectoral adenopathy.  Skin:    General: Skin is warm and dry.     Coloration: Skin is not pale.     Findings: No abrasion, bruising, burn, ecchymosis, erythema, lesion, petechiae or rash.     Nails: There is no clubbing.  Neurological:     Mental Status: She is alert and oriented to person, place, and time.     GCS: GCS eye subscore is 4. GCS verbal subscore is 5. GCS  motor subscore is 6.     Cranial Nerves: No cranial nerve deficit.     Sensory: No sensory deficit.     Motor: No tremor, atrophy, abnormal muscle tone or seizure activity.     Coordination: Coordination normal.     Gait: Gait normal.     Deep Tendon Reflexes: Reflexes are normal and symmetric. Reflexes normal. Babinski sign absent on the right side. Babinski sign absent on the left side.     Reflex Scores:      Tricep reflexes are 2+ on the right side and 2+ on the left side.      Bicep reflexes are 2+ on the right side and 2+ on the left side.      Brachioradialis reflexes are 2+ on the right side and 2+ on the left side.      Patellar reflexes are 2+ on the right side and 2+ on the left side.      Achilles reflexes are 2+ on the right side and 2+ on the left side. Psychiatric:        Speech: Speech normal.        Behavior: Behavior normal.        Thought Content: Thought content normal.        Judgment: Judgment normal.    BP 128/78    Pulse 94    Temp (!) 96.6 F (35.9 C)    Ht 5\' 5"  (1.651 m)    Wt 166 lb 12.8 oz (75.7 kg)    SpO2 98%    BMI 27.76 kg/m  Wt Readings from Last 3 Encounters:  07/20/21 166 lb 12.8 oz (75.7 kg)  12/02/20 166 lb 8 oz (75.5 kg)  09/16/19 166 lb (75.3  kg)    Health Maintenance Due  Topic Date Due   Pneumococcal Vaccine 68-78 Years old (1 - PCV) Never done   PAP-Cervical Cytology Screening  Never done   PAP SMEAR-Modifier  Never done   INFLUENZA VACCINE  03/08/2021   COVID-19 Vaccine (3 - Moderna risk series) 06/21/2021    There are no preventive care reminders to display for this patient.   Lab Results  Component Value Date   TSH 1.540 09/09/2019   Lab Results  Component Value Date   WBC 10.2 09/09/2019   HGB 16.6 (H) 09/09/2019   HCT 46.5 09/09/2019   MCV 86 09/09/2019   PLT 283 09/09/2019   Lab Results  Component Value Date   NA 137 05/27/2019   K 3.8 05/27/2019   CO2 23 05/27/2019   GLUCOSE 90 05/27/2019   BUN 14 05/27/2019    CREATININE 0.72 05/27/2019   BILITOT 0.4 10/17/2019   ALKPHOS 85 10/17/2019   AST 20 10/17/2019   ALT 21 10/17/2019   PROT 7.4 10/17/2019   ALBUMIN 5.1 (H) 10/17/2019   CALCIUM 9.9 05/27/2019   ANIONGAP 7 03/25/2017   GFR 101.37 05/27/2019   Lab Results  Component Value Date   CHOL 166 05/27/2019   Lab Results  Component Value Date   HDL 61.00 05/27/2019   Lab Results  Component Value Date   LDLCALC 94 05/27/2019   Lab Results  Component Value Date   TRIG 51.0 05/27/2019   Lab Results  Component Value Date   CHOLHDL 3 05/27/2019   No results found for: HGBA1C     Assessment & Plan:   Problem List Items Addressed This Visit   None Visit Diagnoses     Mild intermittent asthma without complication    -  Primary   Relevant Medications   levocetirizine (XYZAL) 5 MG tablet   fluticasone-salmeterol (ADVAIR) 100-50 MCG/ACT AEPB   albuterol (VENTOLIN HFA) 108 (90 Base) MCG/ACT inhaler   predniSONE (STERAPRED UNI-PAK 21 TAB) 10 MG (21) TBPK tablet   Other Relevant Orders   DG Chest 2 View   CBC with Differential/Platelet   Comprehensive metabolic panel   TSH        Meds ordered this encounter  Medications   levocetirizine (XYZAL) 5 MG tablet    Sig: Take 1 tablet (5 mg total) by mouth every evening.    Dispense:  90 tablet    Refill:  1   fluticasone-salmeterol (ADVAIR) 100-50 MCG/ACT AEPB    Sig: Inhale 1 puff into the lungs 2 (two) times daily. Rinse mouth after use.    Dispense:  1 each    Refill:  1   albuterol (VENTOLIN HFA) 108 (90 Base) MCG/ACT inhaler    Sig: Inhale 2 puffs into the lungs every 6 (six) hours as needed for wheezing or shortness of breath.    Dispense:  18 g    Refill:  0   predniSONE (STERAPRED UNI-PAK 21 TAB) 10 MG (21) TBPK tablet    Sig: PO: Take 6 tablets on day 1:Take 5 tablets day 2:Take 4 tablets day 3: Take 3 tablets day 4:Take 2 tablets day five: 5 Take 1 tablet day 6    Dispense:  21 tablet    Refill:  0   Can start  Advair once daily after using as prescribed.  Discussed with patient that she could have had a upper respiratory infection on 1130 when she had sinus pressure and drainage, however is probably too late to  test for COVID flu or RSV at this point.  She did feel she had a asthma attack as she did previously 2019.  She does have a cat and has been around heavy smokers and vapors.  She is advised that she should avoid being around the situations.  And avoid allergens.  She has been on Zyrtec for years, we will switch her to Xyzal.  Recommend that she start using her Advair inhaler, budesonide was no longer covered on her insurance and she never picked it up in the past.  Albuterol rescue inhaler and discussed use and when to return if any symptoms worsening.  We will get a baseline chest x-ray today.  Also sent in prednisone Dosepak to decrease allergic response from cigarette smoke that she is recently get around.  She has no signs of illness today. Red Flags discussed. The patient was given clear instructions to go to ER or return to medical center if any red flags develop, symptoms do not improve, worsen or new problems develop. They verbalized understanding.  Return in about 1 month (around 08/20/2021), or if symptoms worsen or fail to improve, for at any time for any worsening symptoms, Go to Emergency room/ urgent care if worse.  Jairo Ben, FNP

## 2021-07-21 ENCOUNTER — Encounter: Payer: Self-pay | Admitting: Adult Health

## 2021-07-21 ENCOUNTER — Other Ambulatory Visit: Payer: Self-pay | Admitting: Adult Health

## 2021-07-21 DIAGNOSIS — J4520 Mild intermittent asthma, uncomplicated: Secondary | ICD-10-CM

## 2021-07-21 MED ORDER — FLUTICASONE-SALMETEROL 100-50 MCG/ACT IN AEPB: 1.0000 | INHALATION_SPRAY | Freq: Two times a day (BID) | RESPIRATORY_TRACT | 1 refills | Status: DC

## 2021-07-21 NOTE — Progress Notes (Signed)
TSH for thyroid within normal limits.  CMP and CBC  within normal limits.

## 2021-07-21 NOTE — Progress Notes (Signed)
Chest x ray is within normal limits.

## 2021-08-11 ENCOUNTER — Other Ambulatory Visit: Payer: Self-pay | Admitting: Adult Health

## 2021-08-11 DIAGNOSIS — J4520 Mild intermittent asthma, uncomplicated: Secondary | ICD-10-CM

## 2021-08-19 ENCOUNTER — Ambulatory Visit: Payer: 59 | Admitting: Internal Medicine

## 2021-10-21 ENCOUNTER — Other Ambulatory Visit: Payer: Self-pay | Admitting: Adult Health

## 2021-10-21 ENCOUNTER — Other Ambulatory Visit: Payer: Self-pay

## 2021-10-21 MED ORDER — FLUTICASONE-SALMETEROL 100-50 MCG/ACT IN AEPB: INHALATION_SPRAY | RESPIRATORY_TRACT | 1 refills | Status: DC

## 2021-12-09 ENCOUNTER — Encounter: Payer: Self-pay | Admitting: Internal Medicine

## 2021-12-09 NOTE — Telephone Encounter (Signed)
Patient is scheduled tomorrow at 3p with Dr. Olivia Mackie.  ?

## 2021-12-10 ENCOUNTER — Encounter: Payer: Self-pay | Admitting: Internal Medicine

## 2021-12-10 ENCOUNTER — Ambulatory Visit (INDEPENDENT_AMBULATORY_CARE_PROVIDER_SITE_OTHER): Payer: 59 | Admitting: Internal Medicine

## 2021-12-10 VITALS — BP 124/78 | HR 85 | Temp 98.1°F | Resp 14 | Ht 65.0 in | Wt 161.4 lb

## 2021-12-10 DIAGNOSIS — B354 Tinea corporis: Secondary | ICD-10-CM

## 2021-12-10 DIAGNOSIS — J4520 Mild intermittent asthma, uncomplicated: Secondary | ICD-10-CM

## 2021-12-10 MED ORDER — CLOTRIMAZOLE 1 % EX CREA: 1.0000 "application " | TOPICAL_CREAM | Freq: Two times a day (BID) | CUTANEOUS | 0 refills | Status: DC

## 2021-12-10 MED ORDER — FLUTICASONE-SALMETEROL 100-50 MCG/ACT IN AEPB
INHALATION_SPRAY | RESPIRATORY_TRACT | 12 refills | Status: DC
Start: 2021-12-10 — End: 2022-07-21

## 2021-12-10 MED ORDER — ALBUTEROL SULFATE HFA 108 (90 BASE) MCG/ACT IN AERS: 1.0000 | INHALATION_SPRAY | Freq: Four times a day (QID) | RESPIRATORY_TRACT | 11 refills | Status: DC | PRN

## 2021-12-10 NOTE — Patient Instructions (Addendum)
Buy over the counter hydrocortisone to use 2x per day with clotrimazole antifungal cream  ?Or  ?Lamisil 2x per day  ?Body Ringworm ?Body ringworm is an infection of the skin that often causes a ring-shaped rash. Body ringworm is also called tinea corporis. ?Body ringworm can affect any part of your skin. This condition is easily spread from person to person (is very contagious). ?What are the causes? ?This condition is caused by fungi called dermatophytes. The condition develops when these fungi grow out of control on the skin. ?You can get this condition if you touch a person or animal that has it. You can also get it if you share any items with an infected person or pet. These include: ?Clothing, bedding, and towels. ?Brushes or combs. ?Gym equipment. ?Any other object that has the fungus on it. ?What increases the risk? ?You are more likely to develop this condition if you: ?Play sports that involve close physical contact, such as wrestling. ?Sweat a lot. ?Live in areas that are hot and humid. ?Use public showers. ?Have a weakened immune system. ?What are the signs or symptoms? ?Symptoms of this condition include: ?Itchy, raised red spots and bumps. ?Red scaly patches. ?A ring-shaped rash. The rash may have: ?A clear center. ?Scales or red bumps at its center. ?Redness near its borders. ?Dry and scaly skin on or around it. ?How is this diagnosed? ?This condition can usually be diagnosed with a skin exam. A skin scraping may be taken from the affected area and examined under a microscope to see if the fungus is present. ?How is this treated? ?This condition may be treated with: ?An antifungal cream or ointment. ?An antifungal shampoo. ?Antifungal medicines. These may be prescribed if your ringworm: ?Is severe. ?Keeps coming back. ?Lasts a long time. ?Follow these instructions at home: ?Take over-the-counter and prescription medicines only as told by your health care provider. ?If you were given an antifungal cream  or ointment: ?Use it as told by your health care provider. ?Wash the infected area and dry it completely before applying the cream or ointment. ?If you were given an antifungal shampoo: ?Use it as told by your health care provider. ?Leave the shampoo on your body for 3-5 minutes before rinsing. ?While you have a rash: ?Wear loose clothing to stop clothes from rubbing and irritating it. ?Wash or change your bed sheets every night. ?Disinfect or throw out items that may be infected. ?Wash clothes and bed sheets in hot water. ?Wash your hands often with soap and water. If soap and water are not available, use hand sanitizer. ?If your pet has the same infection, take your pet to see a veterinarian for treatment. ?How is this prevented? ?Take a bath or shower every day and after every time you work out or play sports. ?Dry your skin completely after bathing. ?Wear sandals or shoes in public places and showers. ?Change your clothes every day. ?Wash athletic clothes after each use. ?Do not share personal items with others. ?Avoid touching red patches of skin on other people. ?Avoid touching pets that have bald spots. ?If you touch an animal that has a bald spot, wash your hands. ?Contact a health care provider if: ?Your rash continues to spread after 7 days of treatment. ?Your rash is not gone in 4 weeks. ?The area around your rash gets red, warm, tender, and swollen. ?Summary ?Body ringworm is an infection of the skin that often causes a ring-shaped rash. ?This condition is easily spread from person to  person (is very contagious). ?This condition may be treated with antifungal cream or ointment, antifungal shampoo, or antifungal medicines. ?Take over-the-counter and prescription medicines only as told by your health care provider. ?This information is not intended to replace advice given to you by your health care provider. Make sure you discuss any questions you have with your health care provider. ?Document Revised:  05/18/2021 Document Reviewed: 05/18/2021 ?Elsevier Patient Education ? 2023 [employer] ? ?

## 2021-12-10 NOTE — Progress Notes (Signed)
Chief Complaint  ?Patient presents with  ? Acute Visit  ?  Pt arrives with poss ringworm under L arm pit, looks like a circle per pt. Denies any itching,burning,pain. Has not applied anything to area.  ? ?F/u  ?1. Left lateral back near underarm has circle posterior to left underarm red around and scaly noticed Monday no itching or burning  ?2. Having jaw pain due to invisalign rec disc dental orthodontics ?3. Asthma controlled wants refills on inhalers ? ? ?Review of Systems  ?Constitutional:  Negative for weight loss.  ?HENT:  Negative for hearing loss.   ?Eyes:  Negative for blurred vision.  ?Respiratory:  Negative for shortness of breath.   ?Cardiovascular:  Negative for chest pain.  ?Gastrointestinal:  Negative for abdominal pain and blood in stool.  ?Genitourinary:  Negative for dysuria.  ?Musculoskeletal:  Negative for falls and joint pain.  ?Skin:  Positive for rash.  ?Neurological:  Negative for headaches.  ?Psychiatric/Behavioral:  Negative for depression.   ?Past Medical History:  ?Diagnosis Date  ? Asthma   ? ?Past Surgical History:  ?Procedure Laterality Date  ? NO PAST SURGERIES    ? TONSILLECTOMY N/A 03/16/2017  ? Procedure: TONSILLECTOMY;  Surgeon: Vernie Murders, MD;  Location: [facility];  Service: ENT;  Laterality: N/A;  ? ?Family History  ?Problem Relation Age of Onset  ? Asthma Mother   ? Dementia Maternal Grandmother   ? Breast cancer Paternal Grandmother   ? Healthy Father   ? ?Social History  ? ?Socioeconomic History  ? Marital status: Single  ?  Spouse name: Not on file  ? Number of children: Not on file  ? Years of education: Not on file  ? Highest education level: Not on file  ?Occupational History  ? Not on file  ?Tobacco Use  ? Smoking status: Never  ? Smokeless tobacco: Never  ?Vaping Use  ? Vaping Use: Never used  ?Substance and Sexual Activity  ? Alcohol use: Yes  ?  Alcohol/week: 1.0 standard drink  ?  Types: 1 Cans of beer per week  ?  Comment: socially  ? Drug use: Never  ?  Sexual activity: Not Currently  ?Other Topics Concern  ? Not on file  ?Social History Narrative  ? Not on file  ? ?Social Determinants of Health  ? ?Financial Resource Strain: Not on file  ?Food Insecurity: Not on file  ?Transportation Needs: Not on file  ?Physical Activity: Not on file  ?Stress: Not on file  ?Social Connections: Not on file  ?Intimate Partner Violence: Not on file  ? ?Current Meds  ?Medication Sig  ? Ascorbic Acid (VITAMIN C) 100 MG tablet Take 100 mg by mouth daily.  ? Calcium Carbonate-Vitamin D 600-400 MG-UNIT tablet calcium carbonate 600 mg-vitamin D3 10 mcg (400 unit) tablet ? Take 1 tablet every day by oral route. ? take calcium supplement as long as on Depo Provera  ? cholecalciferol (VITAMIN D3) 25 MCG (1000 UNIT) tablet Take 1,000 Units by mouth daily.  ? levocetirizine (XYZAL) 5 MG tablet Take 1 tablet (5 mg total) by mouth every evening.  ? MedroxyPROGESTERone Acetate (DEPO-PROVERA IM) Inject into the muscle.  ? predniSONE (STERAPRED UNI-PAK 21 TAB) 10 MG (21) TBPK tablet PO: Take 6 tablets on day 1:Take 5 tablets day 2:Take 4 tablets day 3: Take 3 tablets day 4:Take 2 tablets day five: 5 Take 1 tablet day 6  ? [DISCONTINUED] albuterol (VENTOLIN HFA) 108 (90 Base) MCG/ACT inhaler Inhale 2 puffs  into the lungs every 6 (six) hours as needed for wheezing or shortness of breath.  ? [DISCONTINUED] clotrimazole (CLOTRIMAZOLE AF) 1 % cream Apply 1 application. topically 2 (two) times daily. X 2 weeks  ? [DISCONTINUED] fluticasone-salmeterol (ADVAIR) 100-50 MCG/ACT AEPB INHALE ONE PUFF BY MOUTH TWICE A DAY RINSE MOUTH AFTER USE  ? ?Allergies  ?Allergen Reactions  ? Banana Itching  ?  Throat and ears  ? Carrot [Daucus Carota] Itching  ?  Throat and ears  ? Mushroom Extract Complex Itching  ?  Throat and ears  ? Watermelon [Citrullus Vulgaris] Itching  ?  Throat and ears  ? ?No results found for this or any previous visit (from the past 2160 hour(s)). ?Objective  ?Body mass index is 26.86  kg/m?. ?Wt Readings from Last 3 Encounters:  ?12/10/21 161 lb 6.4 oz (73.2 kg)  ?07/20/21 166 lb 12.8 oz (75.7 kg)  ?12/02/20 166 lb 8 oz (75.5 kg)  ? ?Temp Readings from Last 3 Encounters:  ?12/10/21 98.1 ?F (36.7 ?C) (Oral)  ?07/20/21 (!) 96.6 ?F (35.9 ?C)  ?12/02/20 97.9 ?F (36.6 ?C) (Oral)  ? ?BP Readings from Last 3 Encounters:  ?12/10/21 124/78  ?07/20/21 128/78  ?12/02/20 124/86  ? ?Pulse Readings from Last 3 Encounters:  ?12/10/21 85  ?07/20/21 94  ?12/02/20 83  ? ? ?Physical Exam ?Vitals and nursing note reviewed.  ?Constitutional:   ?   Appearance: Normal appearance. She is well-developed and well-groomed.  ?HENT:  ?   Head: Normocephalic and atraumatic.  ?Eyes:  ?   Conjunctiva/sclera: Conjunctivae normal.  ?   Pupils: Pupils are equal, round, and reactive to light.  ?Cardiovascular:  ?   Rate and Rhythm: Normal rate and regular rhythm.  ?   Heart sounds: Normal heart sounds. No murmur heard. ?Pulmonary:  ?   Effort: Pulmonary effort is normal.  ?   Breath sounds: Normal breath sounds.  ?Abdominal:  ?   General: Abdomen is flat. Bowel sounds are normal.  ?   Tenderness: There is no abdominal tenderness.  ?Musculoskeletal:     ?   General: No tenderness.  ?Skin: ?   General: Skin is warm and dry.  ? ?    ?Neurological:  ?   General: No focal deficit present.  ?   Mental Status: She is alert and oriented to person, place, and time. Mental status is at baseline.  ?   Cranial Nerves: Cranial nerves 2-12 are intact.  ?   Motor: Motor function is intact.  ?   Coordination: Coordination is intact.  ?   Gait: Gait is intact.  ?Psychiatric:     ?   Attention and Perception: Attention and perception normal.     ?   Mood and Affect: Mood and affect normal.     ?   Speech: Speech normal.     ?   Behavior: Behavior normal. Behavior is cooperative.     ?   Thought Content: Thought content normal.     ?   Cognition and Memory: Cognition and memory normal.     ?   Judgment: Judgment normal.  ? ? ?Assessment  ?Plan   ?Tinea corporis - Plan: clotrimazole (CLOTRIMAZOLE AF) 1 % cream, or can buy lamisil  ?Pt also on diflucan for yeast infection orally currently ? ?Mild intermittent asthma without complication - Plan: albuterol (VENTOLIN HFA) 108 (90 Base) MCG/ACT inhaler  ? ?F/u pcp 07/2022 CPE ?Provider: Dr. French Ana McLean-Scocuzza-Internal Medicine  ?

## 2022-01-04 ENCOUNTER — Other Ambulatory Visit: Payer: Self-pay

## 2022-01-21 ENCOUNTER — Encounter: Payer: Self-pay | Admitting: Internal Medicine

## 2022-02-02 ENCOUNTER — Ambulatory Visit
Admission: EM | Admit: 2022-02-02 | Discharge: 2022-02-02 | Disposition: A | Discharge status: Home / Self Care | Payer: 59

## 2022-02-02 ENCOUNTER — Encounter: Payer: Self-pay | Admitting: Emergency Medicine

## 2022-02-02 DIAGNOSIS — M5441 Lumbago with sciatica, right side: Secondary | ICD-10-CM | POA: Diagnosis not present

## 2022-02-02 DIAGNOSIS — S39012A Strain of muscle, fascia and tendon of lower back, initial encounter: Secondary | ICD-10-CM

## 2022-02-02 DIAGNOSIS — B354 Tinea corporis: Secondary | ICD-10-CM | POA: Diagnosis not present

## 2022-02-02 MED ORDER — NAPROXEN 500 MG PO TABS: 500.0000 mg | ORAL_TABLET | Freq: Two times a day (BID) | ORAL | 0 refills | Status: DC

## 2022-02-02 MED ORDER — FLUCONAZOLE 150 MG PO TABS: 150.0000 mg | ORAL_TABLET | ORAL | 0 refills | Status: DC

## 2022-02-02 MED ORDER — TIZANIDINE HCL 4 MG PO TABS: 4.0000 mg | ORAL_TABLET | Freq: Every day | ORAL | 0 refills | Status: DC

## 2022-02-02 NOTE — ED Provider Notes (Signed)
[facility]   MRN: [id number] DOB: [birth year]  Subjective:   Kimberly Moyer is a 26 y.o. female presenting for persistent ringworm rash over the left side of her torso.  She has tried topical medication only as prescribed by previous providers.  In she would also like an evaluation for her back.  Has had persistent back pain for 3 days now.  On Sunday she had to lift a very heavy friend that weighed about 200 pounds.  Since then she has had persistent back pain over the low mid side.  It is worse on the right side.  No changes to bowel or urinary habits.  No hematuria.  No dysuria.  No weakness, numbness or tingling.  No current facility-administered medications for this encounter.  Current Outpatient Medications:    cetirizine (ZYRTEC) 10 MG tablet, Take 10 mg by mouth daily., Disp: , Rfl:    albuterol (VENTOLIN HFA) 108 (90 Base) MCG/ACT inhaler, Inhale 1-2 puffs into the lungs every 6 (six) hours as needed for wheezing or shortness of breath., Disp: 18 g, Rfl: 11   Ascorbic Acid (VITAMIN C) 100 MG tablet, Take 100 mg by mouth daily., Disp: , Rfl:    Calcium Carbonate-Vitamin D 600-400 MG-UNIT tablet, calcium carbonate 600 mg-vitamin D3 10 mcg (400 unit) tablet  Take 1 tablet every day by oral route.  take calcium supplement as long as on Depo Provera, Disp: , Rfl:    cholecalciferol (VITAMIN D3) 25 MCG (1000 UNIT) tablet, Take 1,000 Units by mouth daily., Disp: , Rfl:    clotrimazole (CLOTRIMAZOLE AF) 1 % cream, Apply 1 application. topically 2 (two) times daily. X 2 weeks, Disp: 45 g, Rfl: 0   fluticasone-salmeterol (ADVAIR) 100-50 MCG/ACT AEPB, INHALE ONE PUFF BY MOUTH TWICE A DAY RINSE MOUTH AFTER USE, Disp: 60 each, Rfl: 12   fluticasone-salmeterol (ADVAIR) 100-50 MCG/ACT AEPB, fluticasone 100 mcg-salmeterol 50 mcg/dose blistr powdr for inhalation  INHALE ONE PUFF BY MOUTH TWICE A DAY, Disp: , Rfl:    levocetirizine (XYZAL) 5 MG tablet, Take 1 tablet (5 mg  total) by mouth every evening., Disp: 90 tablet, Rfl: 1   MedroxyPROGESTERone Acetate (DEPO-PROVERA IM), Inject into the muscle., Disp: , Rfl:    predniSONE (STERAPRED UNI-PAK 21 TAB) 10 MG (21) TBPK tablet, PO: Take 6 tablets on day 1:Take 5 tablets day 2:Take 4 tablets day 3: Take 3 tablets day 4:Take 2 tablets day five: 5 Take 1 tablet day 6, Disp: 21 tablet, Rfl: 0   Allergies  Allergen Reactions   Banana Itching    Throat and ears   Carrot [Daucus Carota] Itching    Throat and ears   Mushroom Extract Complex Itching    Throat and ears   Watermelon [Citrullus Vulgaris] Itching    Throat and ears    Past Medical History:  Diagnosis Date   Asthma      Past Surgical History:  Procedure Laterality Date   NO PAST SURGERIES     TONSILLECTOMY N/A 03/16/2017   Procedure: TONSILLECTOMY;  Surgeon: Vernie Murders, MD;  Location: [facility];  Service: ENT;  Laterality: N/A;    Family History  Problem Relation Age of Onset   Asthma Mother    Dementia Maternal Grandmother    Breast cancer Paternal Grandmother    Healthy Father     Social History   Tobacco Use   Smoking status: Never   Smokeless tobacco: Never  Vaping Use   Vaping Use: Never  used  Substance Use Topics   Alcohol use: Yes    Alcohol/week: 1.0 standard drink of alcohol    Types: 1 Cans of beer per week    Comment: socially   Drug use: Never    ROS   Objective:   Vitals: BP 125/80 (BP Location: Left Arm)   Pulse 84   Temp 97.8 F (36.6 C) (Oral)   Resp 14   SpO2 95%   Physical Exam Constitutional:      General: She is not in acute distress.    Appearance: Normal appearance. She is well-developed. She is not ill-appearing, toxic-appearing or diaphoretic.  HENT:     Head: Normocephalic and atraumatic.     Nose: Nose normal.     Mouth/Throat:     Mouth: Mucous membranes are moist.  Eyes:     General: No scleral icterus.       Right eye: No discharge.        Left eye: No discharge.      Extraocular Movements: Extraocular movements intact.  Cardiovascular:     Rate and Rhythm: Normal rate.  Pulmonary:     Effort: Pulmonary effort is normal.  Musculoskeletal:     Lumbar back: Tenderness (sparing the midline) present. No swelling, edema, deformity, signs of trauma, lacerations, spasms or bony tenderness. Normal range of motion. Negative right straight leg raise test and negative left straight leg raise test. No scoliosis.       Back:  Skin:    General: Skin is warm and dry.     Findings: Rash (Multiple annular lesions with central clearing of the left thoracic wall extending to the left arm) present.  Neurological:     General: No focal deficit present.     Mental Status: She is alert and oriented to person, place, and time.  Psychiatric:        Mood and Affect: Mood normal.        Behavior: Behavior normal.     Assessment and Plan :   PDMP not reviewed this encounter.  1. Tinea corporis   2. Acute right-sided low back pain with right-sided sciatica   3. Lumbar strain, initial encounter    Recommended oral fluconazole for tinea corporis as she has failed topical treatment. Will manage conservatively for back strain with NSAID and muscle relaxant, rest and modification of physical activity.  Anticipatory guidance provided.  Counseled patient on potential for adverse effects with medications prescribed/recommended today, ER and return-to-clinic precautions discussed, patient verbalized understanding.    Wallis Bamberg, [place] 02/02/22 [phone]

## 2022-02-02 NOTE — ED Triage Notes (Signed)
Pt here with ringworm rash over entire body that has come and gone with Rx meds, but patient is still having issues with it. Pt also here with medial lumbar back pain x 2 days that began after lifting a 200lb friend. Change in position and stretching has helped some, but pt is visibly uncomfortable in triage with sharp pain.

## 2022-03-09 LAB — HM PAP SMEAR

## 2022-07-21 ENCOUNTER — Ambulatory Visit (INDEPENDENT_AMBULATORY_CARE_PROVIDER_SITE_OTHER): Payer: 59 | Admitting: Internal Medicine

## 2022-07-21 ENCOUNTER — Encounter: Payer: Self-pay | Admitting: Internal Medicine

## 2022-07-21 ENCOUNTER — Other Ambulatory Visit (HOSPITAL_COMMUNITY)
Admission: RE | Admit: 2022-07-21 | Discharge: 2022-07-21 | Disposition: A | Discharge status: Home / Self Care | Payer: 59 | Source: Ambulatory Visit | Attending: Internal Medicine | Admitting: Internal Medicine

## 2022-07-21 VITALS — BP 112/70 | HR 78 | Temp 98.6°F | Resp 15 | Ht 65.0 in | Wt 159.6 lb

## 2022-07-21 DIAGNOSIS — Z23 Encounter for immunization: Secondary | ICD-10-CM

## 2022-07-21 DIAGNOSIS — K754 Autoimmune hepatitis: Secondary | ICD-10-CM

## 2022-07-21 DIAGNOSIS — R7401 Elevation of levels of liver transaminase levels: Secondary | ICD-10-CM

## 2022-07-21 DIAGNOSIS — J4520 Mild intermittent asthma, uncomplicated: Secondary | ICD-10-CM

## 2022-07-21 DIAGNOSIS — N760 Acute vaginitis: Secondary | ICD-10-CM | POA: Insufficient documentation

## 2022-07-21 DIAGNOSIS — Z0001 Encounter for general adult medical examination with abnormal findings: Secondary | ICD-10-CM | POA: Diagnosis not present

## 2022-07-21 LAB — COMPREHENSIVE METABOLIC PANEL
ALT: 37 U/L — ABNORMAL HIGH (ref 0–35)
AST: 27 U/L (ref 0–37)
Albumin: 5 g/dL (ref 3.5–5.2)
Alkaline Phosphatase: 71 U/L (ref 39–117)
BUN: 15 mg/dL (ref 6–23)
CO2: 24 mEq/L (ref 19–32)
Calcium: 10.2 mg/dL (ref 8.4–10.5)
Chloride: 103 mEq/L (ref 96–112)
Creatinine, Ser: 0.82 mg/dL (ref 0.40–1.20)
GFR: 99.66 mL/min (ref >60.00)
Glucose, Bld: 85 mg/dL (ref 70–99)
Potassium: 4.2 mEq/L (ref 3.5–5.1)
Sodium: 137 mEq/L (ref 135–145)
Total Bilirubin: 0.6 mg/dL (ref 0.2–1.2)
Total Protein: 7.6 g/dL (ref 6.0–8.3)

## 2022-07-21 LAB — CBC WITH DIFFERENTIAL/PLATELET
Basophils Absolute: 0 10*3/uL (ref 0.0–0.1)
Basophils Relative: 0.4 % (ref 0.0–3.0)
Eosinophils Absolute: 0.3 10*3/uL (ref 0.0–0.7)
Eosinophils Relative: 2.5 % (ref 0.0–5.0)
HCT: 43.1 % (ref 36.0–46.0)
Hemoglobin: 15 g/dL (ref 12.0–15.0)
Lymphocytes Relative: 17.1 % (ref 12.0–46.0)
Lymphs Abs: 1.7 10*3/uL (ref 0.7–4.0)
MCHC: 34.8 g/dL (ref 30.0–36.0)
MCV: 88.5 fl (ref 78.0–100.0)
Monocytes Absolute: 0.7 10*3/uL (ref 0.1–1.0)
Monocytes Relative: 6.4 % (ref 3.0–12.0)
Neutro Abs: 7.5 10*3/uL (ref 1.4–7.7)
Neutrophils Relative %: 73.6 % (ref 43.0–77.0)
Platelets: 234 10*3/uL (ref 150.0–400.0)
RBC: 4.87 Mil/uL (ref 3.87–5.11)
RDW: 13.1 % (ref 11.5–15.5)
WBC: 10.2 10*3/uL (ref 4.0–10.5)

## 2022-07-21 LAB — TSH: TSH: 2.34 u[IU]/mL (ref 0.35–5.50)

## 2022-07-21 MED ORDER — FLUCONAZOLE 150 MG PO TABS: 150.0000 mg | ORAL_TABLET | Freq: Every day | ORAL | 0 refills | Status: DC

## 2022-07-21 NOTE — Progress Notes (Signed)
Patient ID: Kimberly Moyer, female    DOB: 1995-09-13  Age: 26 y.o. MRN: [id number]  The patient is here for annual preventive examination and management of other chronic and acute problems.   The risk factors are reflected in the social history.   The roster of all physicians providing medical care to patient - is listed in the Snapshot section of the chart.   Activities of daily living:  The patient is 100% independent in all ADLs: dressing, toileting, feeding as well as independent mobility   Home safety : The patient has smoke detectors in the home. They wear seatbelts.  There are no unsecured firearms at home. There is no violence in the home.    There is no risks for hepatitis, STDs or HIV. There is no   history of blood transfusion. They have no travel history to infectious disease endemic areas of the world.   The patient has seen their dentist in the last six month. They have seen their eye doctor in the last year. The patinet  denies slight hearing difficulty with regard to whispered voices and some television programs.  They have deferred audiologic testing in the last year.  They do not  have excessive sun exposure. Discussed the need for sun protection: hats, long sleeves and use of sunscreen if there is significant sun exposure.    Diet: the importance of a healthy diet is discussed. They do have a healthy diet.   The benefits of regular aerobic exercise were discussed. The patient  exercises  3 to 5 days per week  for  60 minutes.    Depression screen: there are no signs or vegative symptoms of depression- irritability, change in appetite, anhedonia, sadness/tearfullness.   The following portions of the patient's history were reviewed and updated as appropriate: allergies, current medications, past family history, past medical history,  past surgical history, past social history  and problem list.   Visual acuity was not assessed per patient preference since the patient has  regular follow up with an  ophthalmologist. Hearing and body mass index were assessed and reviewed.    During the course of the visit the patient was educated and counseled about appropriate screening and preventive services including : fall prevention , diabetes screening, nutrition counseling, colorectal cancer screening, and recommended immunizations.    Chief Complaint:  1) vaginal discharge, itching:  requesting pelvic exam without PAP.   Engages in unprotected sex,  h//o chlamydia .  Std testing encuraged.    2) autoimmune hepatitis:  ruled out after being referred to [facility] nov 2020 . Had ultrasound, normal.  Elevated LFTs was  attributed to exercise.  Has been normal since then.  Has cut chicken . Pork and red meat.    3) treated for ring worm and back strain In May with clotrimazole, then again in June by urgent care with weeky fluconazole,    4)Treated for back strain with  steroid dose pack IN June by urgent care  5) ERYTHROCYTOSIS:  LAST DISCUSSED DURING LAST VISIT , VIRTUAL IN 2021  . CBC WAS NORMAL IN DEC 2022   6) hyperhidrosis affecting axilla , hands  and feet.  Predisposes her to ringworm.  Not using Carpe     Review of Symptoms  Patient denies headache, fevers, malaise, unintentional weight loss, skin rash, eye pain, sinus congestion and sinus pain, sore throat, dysphagia,  hemoptysis , cough, dyspnea, wheezing, chest pain, palpitations, orthopnea, edema, abdominal pain, nausea, melena, diarrhea,  constipation, flank pain, dysuria, hematuria, urinary  Frequency, nocturia, numbness, tingling, seizures,  Focal weakness, Loss of consciousness,  Tremor, insomnia, depression, anxiety, and suicidal ideation.    Physical Exam:  BP 112/70 (BP Location: Left Arm, Patient Position: Sitting, Cuff Size: Small)   Pulse 78   Temp 98.6 F (37 C) (Temporal)   Resp 15   Ht 5\' 5"  (1.651 m)   Wt 159 lb 9.6 oz (72.4 kg)   SpO2 99%   BMI 26.56 kg/m    Physical Exam Vitals  reviewed.  Constitutional:      General: She is not in acute distress.    Appearance: Normal appearance. She is well-developed and normal weight. She is not ill-appearing, toxic-appearing or diaphoretic.  HENT:     Head: Normocephalic.     Right Ear: Tympanic membrane, ear canal and external ear normal. There is no impacted cerumen.     Left Ear: Tympanic membrane, ear canal and external ear normal. There is no impacted cerumen.     Nose: Nose normal.     Mouth/Throat:     Mouth: Mucous membranes are moist.     Pharynx: Oropharynx is clear.  Eyes:     General: No scleral icterus.       Right eye: No discharge.        Left eye: No discharge.     Conjunctiva/sclera: Conjunctivae normal.     Pupils: Pupils are equal, round, and reactive to light.  Neck:     Thyroid: No thyromegaly.     Vascular: No carotid bruit or JVD.  Cardiovascular:     Rate and Rhythm: Normal rate and regular rhythm.     Heart sounds: Normal heart sounds.  Pulmonary:     Effort: Pulmonary effort is normal. No respiratory distress.     Breath sounds: Normal breath sounds.  Chest:  Breasts:    Breasts are symmetrical.     Right: Normal. No swelling, inverted nipple, mass, nipple discharge, skin change or tenderness.     Left: Normal. No swelling, inverted nipple, mass, nipple discharge, skin change or tenderness.  Abdominal:     General: Bowel sounds are normal.     Palpations: Abdomen is soft. There is no mass.     Tenderness: There is no abdominal tenderness. There is no guarding or rebound.     Hernia: There is no hernia in the left inguinal area or right inguinal area.  Genitourinary:    General: Normal vulva.     Exam position: Lithotomy position.     Pubic Area: No rash or pubic lice.      Labia:        Right: No rash, tenderness, lesion or injury.        Left: No rash, tenderness, lesion or injury.      Vagina: Vaginal discharge present.     Cervix: Normal.     Uterus: Normal.      Adnexa: Right  adnexa normal and left adnexa normal.  Musculoskeletal:        General: Normal range of motion.     Cervical back: Normal range of motion and neck supple.  Lymphadenopathy:     Cervical: No cervical adenopathy.     Upper Body:     Right upper body: No supraclavicular, axillary or pectoral adenopathy.     Left upper body: No supraclavicular, axillary or pectoral adenopathy.     Lower Body: No right inguinal adenopathy. No left inguinal adenopathy.  Skin:  General: Skin is warm and dry.  Neurological:     General: No focal deficit present.     Mental Status: She is alert and oriented to person, place, and time. Mental status is at baseline.  Psychiatric:        Mood and Affect: Mood normal.        Behavior: Behavior normal.        Thought Content: Thought content normal.        Judgment: Judgment normal.     Assessment and Plan: Mild intermittent asthma without complication -     CBC with Differential/Platelet -     Comprehensive metabolic panel -     TSH  Acute vaginitis Assessment & Plan: Treating empirically for candida pending results of swab.   Orders: -     Cervicovaginal ancillary only -     Cervicovaginal ancillary only -     HIV Antibody (routine testing w rflx)  Autoimmune hepatitis (HCC)  Need for immunization against influenza -     Flu Vaccine QUAD 61mo+IM (Fluarix, Fluzone & Alfiuria Quad PF)  Elevated ALT measurement Assessment & Plan: Etiology appears to be exercise related based on recent workup   Encounter for general adult medical examination with abnormal findings Assessment & Plan: age appropriate education and counseling updated, referrals for preventative services and immunizations addressed, dietary and smoking counseling addressed, most recent labs reviewed.  I have personally reviewed and have noted:   1) the patient's medical and social history 2) The pt's use of alcohol, tobacco, and illicit drugs 3) The patient's current medications  and supplements 4) Functional ability including ADL's, fall risk, home safety risk, hearing and visual impairment 5) Diet and physical activities 6) Evidence for depression or mood disorder 7) The patient's height, weight, and BMI have been recorded in the chartI have made referrals, and provided counseling and education based on review of the above    Other orders -     Fluconazole; Take 1 tablet (150 mg total) by mouth daily.  Dispense: 2 tablet; Refill: 0    Return in about 1 year (around 07/22/2023).  Sherlene Shams, MD

## 2022-07-21 NOTE — Patient Instructions (Signed)
Take fluconazole once a day for 2 days  The test for other infections will take a few days to result  Abstain from unprotected sex until other infections have been ruled out   [facility]!!

## 2022-07-22 LAB — HIV ANTIBODY (ROUTINE TESTING W REFLEX): HIV 1&2 Ab, 4th Generation: NONREACTIVE

## 2022-07-24 DIAGNOSIS — Z0001 Encounter for general adult medical examination with abnormal findings: Secondary | ICD-10-CM | POA: Insufficient documentation

## 2022-07-24 DIAGNOSIS — N760 Acute vaginitis: Secondary | ICD-10-CM | POA: Insufficient documentation

## 2022-07-24 NOTE — Assessment & Plan Note (Signed)

## 2022-07-24 NOTE — Assessment & Plan Note (Signed)
Treating empirically for candida pending results of swab.

## 2022-07-24 NOTE — Assessment & Plan Note (Signed)
Etiology appears to be exercise related based on recent workup

## 2022-07-25 LAB — CERVICOVAGINAL ANCILLARY ONLY
Bacterial Vaginitis (gardnerella): NEGATIVE
Candida Glabrata: NEGATIVE
Candida Vaginitis: NEGATIVE
Chlamydia: NEGATIVE
Comment: NEGATIVE
Comment: NEGATIVE
Comment: NEGATIVE
Comment: NEGATIVE
Comment: NEGATIVE
Comment: NORMAL
Neisseria Gonorrhea: NEGATIVE
Trichomonas: NEGATIVE

## 2022-10-11 ENCOUNTER — Encounter: Payer: Self-pay | Admitting: Internal Medicine

## 2022-10-18 IMAGING — DX DG CHEST 2V
2 series · 2 of 2 positions shown · non-contrast
Comparison: None.

CLINICAL DATA: 25-year-old female with history of asthma

EXAM:
CHEST - 2 VIEW

[chest pa]
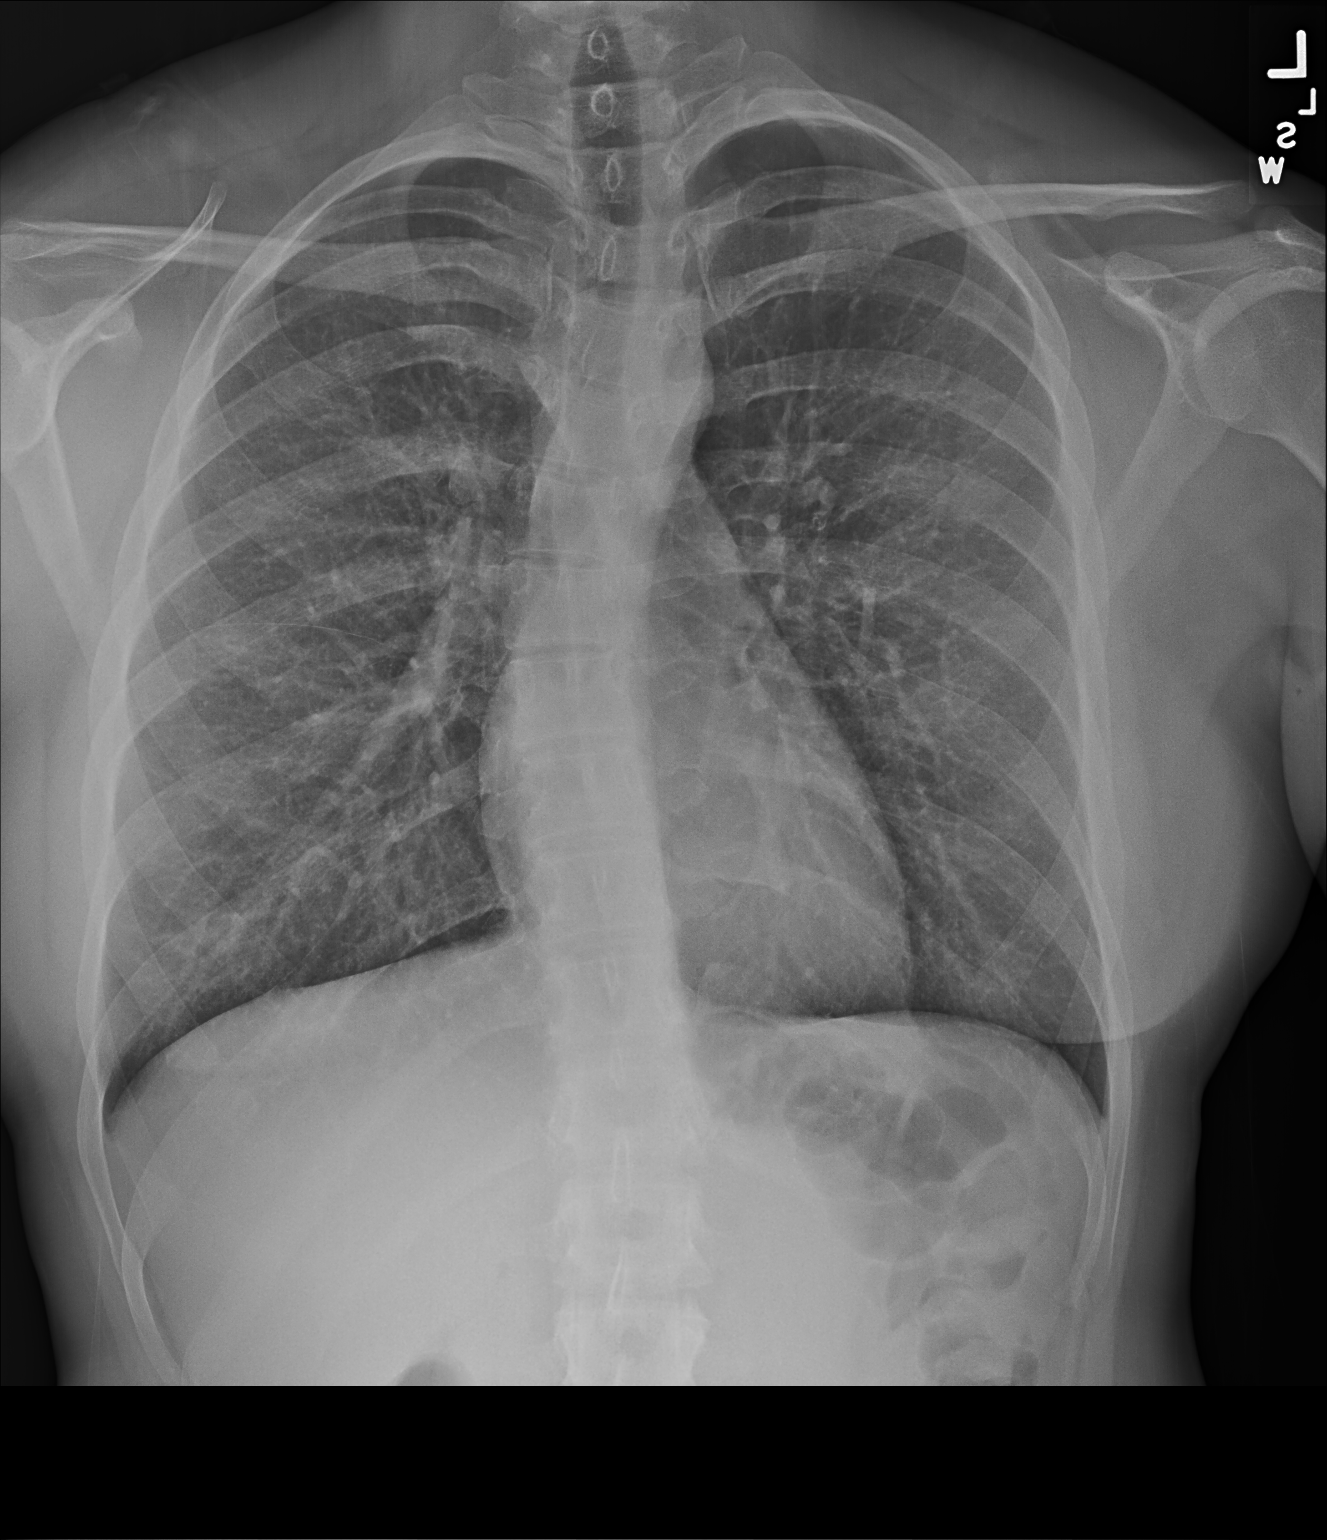

[chest lat]
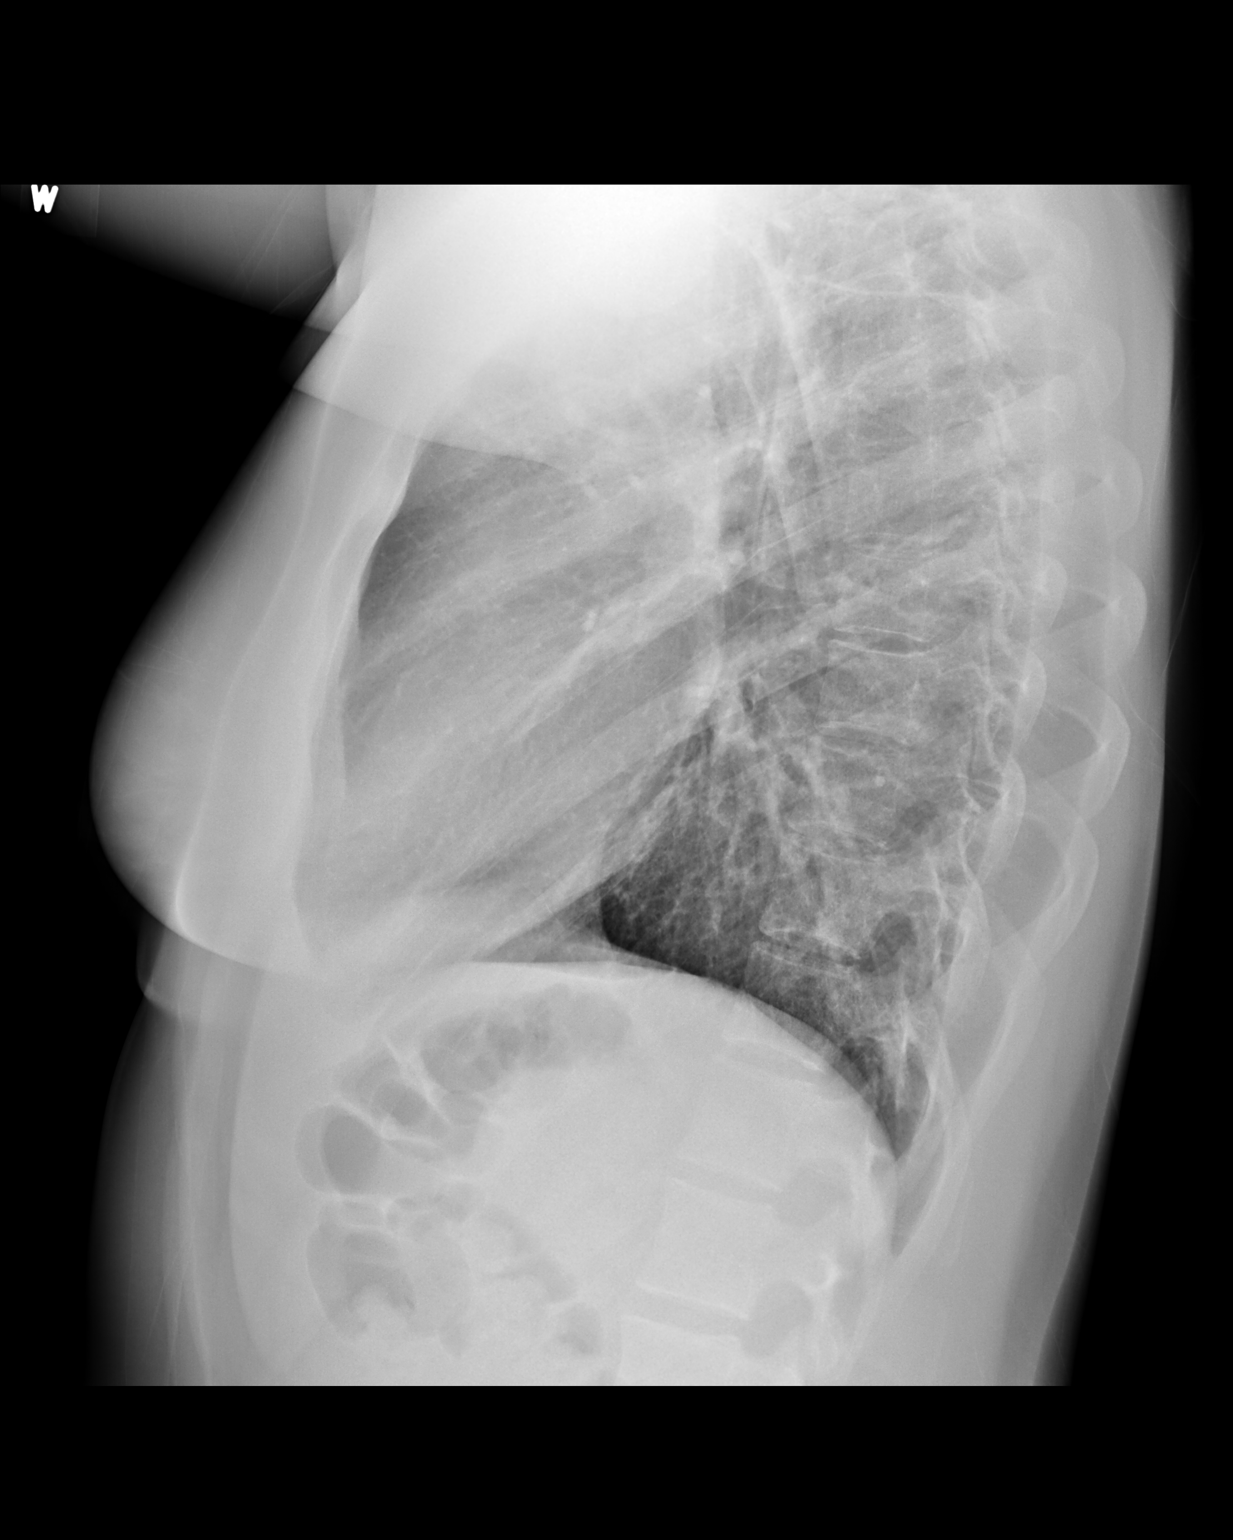

[2 of 2 positions shown; findings below may reference images not displayed]

FINDINGS: Cardiomediastinal silhouette unchanged in size and contour. No
evidence of central vascular congestion. No interlobular septal
thickening. No pneumothorax or pleural effusion. No confluent
airspace disease.

No acute displaced fracture.

Similar appearance of scoliotic curvature
IMPRESSION: No active cardiopulmonary disease.

## 2023-02-01 ENCOUNTER — Telehealth (INDEPENDENT_AMBULATORY_CARE_PROVIDER_SITE_OTHER): Payer: 59 | Admitting: Internal Medicine

## 2023-02-01 ENCOUNTER — Encounter: Payer: Self-pay | Admitting: Internal Medicine

## 2023-02-01 VITALS — Ht 65.0 in | Wt 160.0 lb

## 2023-02-01 DIAGNOSIS — R4589 Other symptoms and signs involving emotional state: Secondary | ICD-10-CM

## 2023-02-01 DIAGNOSIS — J4520 Mild intermittent asthma, uncomplicated: Secondary | ICD-10-CM

## 2023-02-01 MED ORDER — ALBUTEROL SULFATE HFA 108 (90 BASE) MCG/ACT IN AERS: 1.0000 | INHALATION_SPRAY | Freq: Four times a day (QID) | RESPIRATORY_TRACT | 11 refills | Status: DC | PRN

## 2023-02-01 NOTE — Progress Notes (Unsigned)
Virtual Visit via Caregility   Note   This format is felt to be most appropriate for this patient at this time.  All issues noted in this document were discussed and addressed.  No physical exam was performed (except for noted visual exam findings with Video Visits).   I connected withNAME@ on 02/01/23 at  4:30 PM EDT by a video enabled telemedicine application or telephone and verified that I am speaking with the correct person using two identifiers. Location patient: home Location provider: work or home office Persons participating in the virtual visit: patient, provider  I discussed the limitations, risks, security and privacy concerns of performing an evaluation and management service by telephone and the availability of in person appointments. I also discussed with the patient that there may be a patient responsible charge related to this service. The patient expressed understanding and agreed to proceed.  Interactive audio and video telecommunications were attempted between this provider and patient, however failed, due to patient having technical difficulties OR patient did not have access to video capability.  We continued and completed visit with audio only. ***  Reason for visit: CONCERN about increased incidence of cancer in students at [employer] college   HPI:  Kimberly Moyer is a health 27 yr old female who recently learned from a new article that  45 people that attended [employer]  over the last 8 years have been diagnosed with various cancers.      ROS: See pertinent positives and negatives per HPI.  Past Medical History:  Diagnosis Date   Asthma     Past Surgical History:  Procedure Laterality Date   NO PAST SURGERIES     TONSILLECTOMY N/A 03/16/2017   Procedure: TONSILLECTOMY;  Surgeon: Vernie Murders, MD;  Location: [facility];  Service: ENT;  Laterality: N/A;    Family History  Problem Relation Age of Onset   Asthma Mother    Dementia Maternal Grandmother     Breast cancer Paternal Grandmother    Healthy Father     SOCIAL HX: ***   Current Outpatient Medications:    Ascorbic Acid (VITAMIN C) 100 MG tablet, Take 100 mg by mouth daily., Disp: , Rfl:    Calcium Carbonate-Vitamin D 600-400 MG-UNIT tablet, calcium carbonate 600 mg-vitamin D3 10 mcg (400 unit) tablet  Take 1 tablet every day by oral route.  take calcium supplement as long as on Depo Provera, Disp: , Rfl:    cetirizine (ZYRTEC) 10 MG tablet, Take 10 mg by mouth daily., Disp: , Rfl:    MedroxyPROGESTERone Acetate (DEPO-PROVERA IM), Inject into the muscle., Disp: , Rfl:    albuterol (VENTOLIN HFA) 108 (90 Base) MCG/ACT inhaler, Inhale 1-2 puffs into the lungs every 6 (six) hours as needed for wheezing or shortness of breath., Disp: 18 g, Rfl: 11  EXAM:  VITALS per patient if applicable:  GENERAL: alert, oriented, appears well and in no acute distress  HEENT: atraumatic, conjunttiva clear, no obvious abnormalities on inspection of external nose and ears  NECK: normal movements of the head and neck  LUNGS: on inspection no signs of respiratory distress, breathing rate appears normal, no obvious gross SOB, gasping or wheezing  CV: no obvious cyanosis  MS: moves all visible extremities without noticeable abnormality  PSYCH/NEURO: pleasant and cooperative, no obvious depression or anxiety, speech and thought processing grossly intact  ASSESSMENT AND PLAN: Mild intermittent asthma without complication -     Albuterol Sulfate HFA; Inhale 1-2 puffs into the lungs every 6 (  six) hours as needed for wheezing or shortness of breath.  Dispense: 18 g; Refill: 11      I discussed the assessment and treatment plan with the patient. The patient was provided an opportunity to ask questions and all were answered. The patient agreed with the plan and demonstrated an understanding of the instructions.   The patient was advised to call back or seek an in-person evaluation if the symptoms  worsen or if the condition fails to improve as anticipated.   I spent 30 minutes dedicated to the care of this patient on the date of this encounter to include pre-visit review of his medical history,  Face-to-face time with the patient , and post visit ordering of testing and therapeutics.    Sherlene Shams, MD

## 2023-02-02 ENCOUNTER — Telehealth: Payer: Self-pay | Admitting: Internal Medicine

## 2023-02-02 DIAGNOSIS — R4589 Other symptoms and signs involving emotional state: Secondary | ICD-10-CM | POA: Insufficient documentation

## 2023-02-02 NOTE — Assessment & Plan Note (Signed)
Reassurance given that the incidence of cancer is not as elevated as it seems .  She will schedule an appt for a comprehensive exam

## 2023-02-02 NOTE — Telephone Encounter (Signed)
Pt had a Mychart video call with provider on 02/01/23. However, as per check out notes, provider wants pt to do a CPE next month without Pap. Pt just had a physical on 07/21/22. Please advice.

## 2023-02-03 NOTE — Telephone Encounter (Signed)
LMTCB

## 2023-02-13 NOTE — Telephone Encounter (Signed)
Pt has been scheduled for 03/13/2023.

## 2023-03-13 ENCOUNTER — Encounter: Payer: Self-pay | Admitting: Internal Medicine

## 2023-03-13 ENCOUNTER — Other Ambulatory Visit: Payer: Self-pay | Admitting: Internal Medicine

## 2023-03-13 ENCOUNTER — Ambulatory Visit (INDEPENDENT_AMBULATORY_CARE_PROVIDER_SITE_OTHER): Payer: 59 | Admitting: Internal Medicine

## 2023-03-13 VITALS — BP 120/76 | HR 81 | Ht 65.0 in | Wt 166.8 lb

## 2023-03-13 DIAGNOSIS — Z862 Personal history of diseases of the blood and blood-forming organs and certain disorders involving the immune mechanism: Secondary | ICD-10-CM

## 2023-03-13 DIAGNOSIS — R5383 Other fatigue: Secondary | ICD-10-CM | POA: Diagnosis not present

## 2023-03-13 DIAGNOSIS — Z3042 Encounter for surveillance of injectable contraceptive: Secondary | ICD-10-CM

## 2023-03-13 DIAGNOSIS — R7401 Elevation of levels of liver transaminase levels: Secondary | ICD-10-CM

## 2023-03-13 DIAGNOSIS — B353 Tinea pedis: Secondary | ICD-10-CM | POA: Insufficient documentation

## 2023-03-13 DIAGNOSIS — Z124 Encounter for screening for malignant neoplasm of cervix: Secondary | ICD-10-CM | POA: Diagnosis not present

## 2023-03-13 DIAGNOSIS — R011 Cardiac murmur, unspecified: Secondary | ICD-10-CM | POA: Insufficient documentation

## 2023-03-13 LAB — CBC WITH DIFFERENTIAL/PLATELET
Basophils Absolute: 0 10*3/uL (ref 0.0–0.1)
Basophils Relative: 0.5 % (ref 0.0–3.0)
Eosinophils Absolute: 0.3 10*3/uL (ref 0.0–0.7)
Eosinophils Relative: 3.2 % (ref 0.0–5.0)
HCT: 42.6 % (ref 36.0–46.0)
Hemoglobin: 14.4 g/dL (ref 12.0–15.0)
Lymphocytes Relative: 24.8 % (ref 12.0–46.0)
Lymphs Abs: 2 10*3/uL (ref 0.7–4.0)
MCHC: 33.8 g/dL (ref 30.0–36.0)
MCV: 89.6 fl (ref 78.0–100.0)
Monocytes Absolute: 0.7 10*3/uL (ref 0.1–1.0)
Monocytes Relative: 8.2 % (ref 3.0–12.0)
Neutro Abs: 5.2 10*3/uL (ref 1.4–7.7)
Neutrophils Relative %: 63.3 % (ref 43.0–77.0)
Platelets: 226 10*3/uL (ref 150.0–400.0)
RBC: 4.75 Mil/uL (ref 3.87–5.11)
RDW: 12.9 % (ref 11.5–15.5)
WBC: 8.2 10*3/uL (ref 4.0–10.5)

## 2023-03-13 LAB — COMPREHENSIVE METABOLIC PANEL
ALT: 23 U/L (ref 0–35)
AST: 19 U/L (ref 0–37)
Albumin: 4.7 g/dL (ref 3.5–5.2)
Alkaline Phosphatase: 64 U/L (ref 39–117)
BUN: 15 mg/dL (ref 6–23)
CO2: 23 mEq/L (ref 19–32)
Calcium: 10 mg/dL (ref 8.4–10.5)
Chloride: 106 mEq/L (ref 96–112)
Creatinine, Ser: 0.81 mg/dL (ref 0.40–1.20)
GFR: 100.68 mL/min (ref >60.00)
Glucose, Bld: 98 mg/dL (ref 70–99)
Potassium: 3.9 mEq/L (ref 3.5–5.1)
Sodium: 139 mEq/L (ref 135–145)
Total Bilirubin: 0.8 mg/dL (ref 0.2–1.2)
Total Protein: 7.3 g/dL (ref 6.0–8.3)

## 2023-03-13 LAB — LIPID PANEL
Cholesterol: 147 mg/dL (ref 0–200)
HDL: 58.2 mg/dL (ref >39.00)
LDL Cholesterol: 76 mg/dL (ref 0–99)
NonHDL: 88.97
Total CHOL/HDL Ratio: 3
Triglycerides: 64 mg/dL (ref 0.0–149.0)
VLDL: 12.8 mg/dL (ref 0.0–40.0)

## 2023-03-13 LAB — TSH: TSH: 1.58 u[IU]/mL (ref 0.35–5.50)

## 2023-03-13 MED ORDER — KETOCONAZOLE 2 % EX CREA: 1.0000 | TOPICAL_CREAM | Freq: Every day | CUTANEOUS | 2 refills | Status: DC

## 2023-03-13 MED ORDER — CLOTRIMAZOLE 1 % EX CREA: 1.0000 | TOPICAL_CREAM | Freq: Two times a day (BID) | CUTANEOUS | 2 refills | Status: AC

## 2023-03-13 NOTE — Assessment & Plan Note (Signed)
Etiology appears to be exercise related based on recent workup including normal ultrasound.  Checking today with CK

## 2023-03-13 NOTE — Assessment & Plan Note (Signed)
Present since high school.  No syncopal events/

## 2023-03-13 NOTE — Progress Notes (Signed)
Subjective:  Patient ID: Kimberly Moyer, female    DOB: [birth year]  Age: 27 y.o. MRN: [id number]  CC: The primary encounter diagnosis was Cervical cancer screening. Diagnoses of Elevated ALT measurement, History of iron deficiency anemia, Other fatigue, Systolic ejection murmur, Tinea pedis of both feet, and Encounter for surveillance of injectable contraceptive were also pertinent to this visit.   HPI Kimberly Moyer presents for  Chief Complaint  Patient presents with   Medical Management of Chronic Issues   1) anxiety about health:  reviewed her concerns about recent news articles discussing a large number of cancer cases  reported in alumni of her college.   2) STD screening:  was screened last week by GYN but has had unprotected sex since then.    3)  sweaty feet :  foot  rash itchy , has spread despite using an otc cream for athlete's foot :  lamisil cream  : terbinafine 1%    4) leaving for [facility] tomorrow   Outpatient Medications Prior to Visit  Medication Sig Dispense Refill   albuterol (VENTOLIN HFA) 108 (90 Base) MCG/ACT inhaler Inhale 1-2 puffs into the lungs every 6 (six) hours as needed for wheezing or shortness of breath. 18 g 11   Ascorbic Acid (VITAMIN C) 100 MG tablet Take 100 mg by mouth daily.     Calcium Carbonate-Vitamin D 600-400 MG-UNIT tablet calcium carbonate 600 mg-vitamin D3 10 mcg (400 unit) tablet  Take 1 tablet every day by oral route.  take calcium supplement as long as on Depo Provera     cetirizine (ZYRTEC) 10 MG tablet Take 10 mg by mouth daily.     MedroxyPROGESTERone Acetate (DEPO-PROVERA IM) Inject into the muscle.     Probiotic Product (PROBIOTIC DAILY PO) Take 1 capsule by mouth daily.     Vitamin D, Cholecalciferol, 25 MCG (1000 UT) TABS Take 1 capsule by mouth daily.     No facility-administered medications prior to visit.    Review of Systems;  Patient denies headache, fevers, malaise, unintentional weight loss, skin rash, eye pain,  sinus congestion and sinus pain, sore throat, dysphagia,  hemoptysis , cough, dyspnea, wheezing, chest pain, palpitations, orthopnea, edema, abdominal pain, nausea, melena, diarrhea, constipation, flank pain, dysuria, hematuria, urinary  Frequency, nocturia, numbness, tingling, seizures,  Focal weakness, Loss of consciousness,  Tremor, insomnia, depression, anxiety, and suicidal ideation.      Objective:  BP 120/76   Pulse 81   Ht 5\' 5"  (1.651 m)   Wt 166 lb 12.8 oz (75.7 kg)   SpO2 99%   BMI 27.76 kg/m   BP Readings from Last 3 Encounters:  03/13/23 120/76  07/21/22 112/70  02/02/22 125/80    Wt Readings from Last 3 Encounters:  03/13/23 166 lb 12.8 oz (75.7 kg)  02/01/23 160 lb (72.6 kg)  07/21/22 159 lb 9.6 oz (72.4 kg)    Physical Exam Vitals reviewed.  Constitutional:      General: She is not in acute distress.    Appearance: Normal appearance. She is normal weight. She is not ill-appearing, toxic-appearing or diaphoretic.  HENT:     Head: Normocephalic.  Eyes:     General: No scleral icterus.       Right eye: No discharge.        Left eye: No discharge.     Conjunctiva/sclera: Conjunctivae normal.  Cardiovascular:     Rate and Rhythm: Normal rate and regular rhythm.     Heart sounds: Normal  heart sounds.  Pulmonary:     Effort: Pulmonary effort is normal. No respiratory distress.     Breath sounds: Normal breath sounds.  Musculoskeletal:        General: Normal range of motion.       Feet:  Feet:     Right foot:     Toenail Condition: Right toenails are normal.     Left foot:     Toenail Condition: Left toenails are normal.     Comments: Papular rash along lateral borders of soles /heels  Skin:    General: Skin is warm and dry.     Findings: Rash present.  Neurological:     General: No focal deficit present.     Mental Status: She is alert and oriented to person, place, and time. Mental status is at baseline.  Psychiatric:        Mood and Affect: Mood  normal.        Behavior: Behavior normal.        Thought Content: Thought content normal.        Judgment: Judgment normal.    No results found for: "HGBA1C"  Lab Results  Component Value Date   CREATININE 0.82 07/21/2022   CREATININE 0.78 07/20/2021   CREATININE 0.72 05/27/2019    Lab Results  Component Value Date   WBC 10.2 07/21/2022   HGB 15.0 07/21/2022   HCT 43.1 07/21/2022   PLT 234.0 07/21/2022   GLUCOSE 85 07/21/2022   CHOL 166 05/27/2019   TRIG 51.0 05/27/2019   HDL 61.00 05/27/2019   LDLCALC 94 05/27/2019   ALT 37 (H) 07/21/2022   AST 27 07/21/2022   NA 137 07/21/2022   K 4.2 07/21/2022   CL 103 07/21/2022   CREATININE 0.82 07/21/2022   BUN 15 07/21/2022   CO2 24 07/21/2022   TSH 2.34 07/21/2022    No results found.  Assessment & Plan:  .Cervical cancer screening  Elevated ALT measurement Assessment & Plan: Etiology appears to be exercise related based on recent workup including normal ultrasound.  Checking today with [facility]  Orders: -     Comprehensive metabolic panel -     Lipid panel  History of iron deficiency anemia -     CBC with Differential/Platelet  Other fatigue -     TSH  Systolic ejection murmur Assessment & Plan: Present since high school.  No syncopal events/    Tinea pedis of both feet Assessment & Plan: No improvement with OTC terbinafine 1%.  Trial of ketoconazole    Encounter for surveillance of injectable contraceptive Assessment & Plan: Receivn Depo Provera from GYN    Other orders -     Ketoconazole; Apply 1 Application topically daily.  Dispense: 30 g; Refill: 2     I provided of face-to-face time during this encounter reviewing patient's last visit with me, patient's  most recent visit with GYN, recent surgical and non surgical procedures, previous  labs and imaging studies, counseling on currently addressed issues,  and post visit ordering to diagnostics and therapeutics .   Follow-up: No  follow-ups on file.   Sherlene Shams, MD

## 2023-03-13 NOTE — Patient Instructions (Signed)
I have prescribed ketoconazole cream to use daily for the next 2-3 weeks

## 2023-03-13 NOTE — Assessment & Plan Note (Signed)
Receivn Depo Provera from GYN

## 2023-03-13 NOTE — Assessment & Plan Note (Signed)
No improvement with OTC terbinafine 1%.  Trial of ketoconazole

## 2023-03-21 ENCOUNTER — Encounter: Payer: Self-pay | Admitting: Internal Medicine

## 2023-07-18 ENCOUNTER — Telehealth: Payer: Self-pay

## 2023-07-18 DIAGNOSIS — J4520 Mild intermittent asthma, uncomplicated: Secondary | ICD-10-CM

## 2023-07-18 MED ORDER — ALBUTEROL SULFATE HFA 108 (90 BASE) MCG/ACT IN AERS
1.0000 | INHALATION_SPRAY | Freq: Four times a day (QID) | RESPIRATORY_TRACT | 11 refills | Status: AC | PRN
Start: 2023-07-18 — End: ?

## 2023-07-18 NOTE — Telephone Encounter (Signed)
refilled 

## 2023-07-18 NOTE — Telephone Encounter (Signed)
Prescription Request  07/18/2023  LOV: Visit date not found  What is the name of the medication or equipment? albuterol (VENTOLIN HFA) 108 (90 Base) MCG/ACT inhaler  Have you contacted your pharmacy to request a refill? No   Which pharmacy would you like this sent to?  [facility] [address] [place] - [address] [place] [address] Phone: [phone] Fax: [phone]    Patient notified that their request is being sent to the clinical staff for review and that they should receive a response within 2 business days.   Please advise at Mobile [phone] (mobile)  Patient states she is quitting her job and will be losing her insurance, so she would like to go ahead and get a refill on her inhaler.

## 2023-07-24 ENCOUNTER — Encounter: Payer: 59 | Admitting: Internal Medicine
